# Patient Record
Sex: Male | Born: 1965 | Race: White | Hispanic: No | Marital: Married | Smoking: Light tobacco smoker
Health system: Southern US, Community
[De-identification: ages and names within clinical notes are randomized; demographics above are authoritative.]

## PROBLEM LIST (undated history)

## (undated) DIAGNOSIS — M1990 Unspecified osteoarthritis, unspecified site: Secondary | ICD-10-CM

## (undated) DIAGNOSIS — K219 Gastro-esophageal reflux disease without esophagitis: Secondary | ICD-10-CM

## (undated) DIAGNOSIS — K30 Functional dyspepsia: Secondary | ICD-10-CM

## (undated) DIAGNOSIS — E039 Hypothyroidism, unspecified: Secondary | ICD-10-CM

## (undated) DIAGNOSIS — K5792 Diverticulitis of intestine, part unspecified, without perforation or abscess without bleeding: Secondary | ICD-10-CM

## (undated) DIAGNOSIS — E079 Disorder of thyroid, unspecified: Secondary | ICD-10-CM

## (undated) HISTORY — PX: OTHER SURGICAL HISTORY: SHX169

## (undated) HISTORY — DX: Diverticulitis of intestine, part unspecified, without perforation or abscess without bleeding: K57.92

## (undated) HISTORY — PX: KNEE ARTHROSCOPY: SHX127

## (undated) HISTORY — PX: COLON SURGERY: SHX602

## (undated) SURGERY — REPAIR, HERNIA, VENTRAL, LAPAROSCOPY-ASSISTED
Anesthesia: General

---

## 2004-09-07 ENCOUNTER — Ambulatory Visit: Payer: Self-pay | Admitting: Unknown Physician Specialty

## 2004-12-28 ENCOUNTER — Ambulatory Visit: Payer: Self-pay | Admitting: Family Medicine

## 2005-01-06 ENCOUNTER — Ambulatory Visit: Payer: Self-pay | Admitting: Family Medicine

## 2009-08-16 ENCOUNTER — Encounter: Payer: Self-pay | Admitting: Psychiatry

## 2009-09-10 ENCOUNTER — Encounter: Payer: Self-pay | Admitting: Psychiatry

## 2010-02-08 ENCOUNTER — Ambulatory Visit: Payer: Self-pay

## 2010-09-26 ENCOUNTER — Ambulatory Visit: Payer: Self-pay | Admitting: Family Medicine

## 2011-07-17 ENCOUNTER — Ambulatory Visit: Payer: Self-pay | Admitting: Internal Medicine

## 2013-11-11 DIAGNOSIS — K5720 Diverticulitis of large intestine with perforation and abscess without bleeding: Secondary | ICD-10-CM | POA: Insufficient documentation

## 2014-03-04 DIAGNOSIS — Z933 Colostomy status: Secondary | ICD-10-CM | POA: Insufficient documentation

## 2014-11-16 ENCOUNTER — Inpatient Hospital Stay
Admission: EM | Admit: 2014-11-16 | Discharge: 2014-11-18 | DRG: 379 | Disposition: A | Discharge status: Home / Self Care | Payer: BC Managed Care – PPO | Attending: Internal Medicine | Admitting: Internal Medicine

## 2014-11-16 ENCOUNTER — Encounter: Payer: Self-pay | Admitting: Emergency Medicine

## 2014-11-16 ENCOUNTER — Emergency Department: Payer: BC Managed Care – PPO

## 2014-11-16 DIAGNOSIS — K219 Gastro-esophageal reflux disease without esophagitis: Secondary | ICD-10-CM | POA: Diagnosis present

## 2014-11-16 DIAGNOSIS — Z8349 Family history of other endocrine, nutritional and metabolic diseases: Secondary | ICD-10-CM | POA: Diagnosis not present

## 2014-11-16 DIAGNOSIS — K922 Gastrointestinal hemorrhage, unspecified: Principal | ICD-10-CM

## 2014-11-16 DIAGNOSIS — F17210 Nicotine dependence, cigarettes, uncomplicated: Secondary | ICD-10-CM | POA: Diagnosis present

## 2014-11-16 DIAGNOSIS — K921 Melena: Secondary | ICD-10-CM

## 2014-11-16 DIAGNOSIS — F1010 Alcohol abuse, uncomplicated: Secondary | ICD-10-CM | POA: Diagnosis present

## 2014-11-16 DIAGNOSIS — E039 Hypothyroidism, unspecified: Secondary | ICD-10-CM | POA: Diagnosis present

## 2014-11-16 DIAGNOSIS — Z833 Family history of diabetes mellitus: Secondary | ICD-10-CM

## 2014-11-16 DIAGNOSIS — Z8261 Family history of arthritis: Secondary | ICD-10-CM

## 2014-11-16 DIAGNOSIS — M1990 Unspecified osteoarthritis, unspecified site: Secondary | ICD-10-CM | POA: Diagnosis present

## 2014-11-16 DIAGNOSIS — R1013 Epigastric pain: Secondary | ICD-10-CM

## 2014-11-16 HISTORY — DX: Unspecified osteoarthritis, unspecified site: M19.90

## 2014-11-16 HISTORY — DX: Disorder of thyroid, unspecified: E07.9

## 2014-11-16 LAB — CBC
HCT: 39.3 % — ABNORMAL LOW (ref 40.0–52.0)
Hemoglobin: 13.6 g/dL (ref 13.0–18.0)
MCH: 30.2 pg (ref 26.0–34.0)
MCHC: 34.5 g/dL (ref 32.0–36.0)
MCV: 87.4 fL (ref 80.0–100.0)
PLATELETS: 220 10*3/uL (ref 150–440)
RBC: 4.5 MIL/uL (ref 4.40–5.90)
RDW: 12.7 % (ref 11.5–14.5)
WBC: 10.2 10*3/uL (ref 3.8–10.6)

## 2014-11-16 LAB — COMPREHENSIVE METABOLIC PANEL
ALK PHOS: 78 U/L (ref 38–126)
ALT: 17 U/L (ref 17–63)
AST: 41 U/L (ref 15–41)
Albumin: 4.3 g/dL (ref 3.5–5.0)
Anion gap: 8 (ref 5–15)
BILIRUBIN TOTAL: 0.7 mg/dL (ref 0.3–1.2)
BUN: 15 mg/dL (ref 6–20)
CALCIUM: 9.5 mg/dL (ref 8.9–10.3)
CHLORIDE: 102 mmol/L (ref 101–111)
CO2: 26 mmol/L (ref 22–32)
CREATININE: 1.17 mg/dL (ref 0.61–1.24)
Glucose, Bld: 122 mg/dL — ABNORMAL HIGH (ref 65–99)
Potassium: 4.3 mmol/L (ref 3.5–5.1)
Sodium: 136 mmol/L (ref 135–145)
TOTAL PROTEIN: 6.9 g/dL (ref 6.5–8.1)

## 2014-11-16 LAB — HEMOGLOBIN: Hemoglobin: 12.7 g/dL — ABNORMAL LOW (ref 13.0–18.0)

## 2014-11-16 LAB — TYPE AND SCREEN
ABO/RH(D): B POS
ANTIBODY SCREEN: NEGATIVE

## 2014-11-16 LAB — ABO/RH: ABO/RH(D): B POS

## 2014-11-16 LAB — LIPASE, BLOOD: LIPASE: 24 U/L (ref 22–51)

## 2014-11-16 LAB — TROPONIN I

## 2014-11-16 MED ORDER — MORPHINE SULFATE (PF) 4 MG/ML IV SOLN
INTRAVENOUS | Status: AC
  Administered 2014-11-16: 4 mg via INTRAVENOUS
  Filled 2014-11-16: qty 1

## 2014-11-16 MED ORDER — SENNOSIDES-DOCUSATE SODIUM 8.6-50 MG PO TABS: 1.0000 | ORAL_TABLET | Freq: Every evening | ORAL | Status: DC | PRN

## 2014-11-16 MED ORDER — PANTOPRAZOLE SODIUM 40 MG IV SOLR
40.0000 mg | Freq: Two times a day (BID) | INTRAVENOUS | Status: DC
  Administered 2014-11-16 – 2014-11-18 (×4): 40 mg via INTRAVENOUS
  Filled 2014-11-16 (×5): qty 40

## 2014-11-16 MED ORDER — ONDANSETRON HCL 4 MG/2ML IJ SOLN
INTRAMUSCULAR | Status: AC
  Administered 2014-11-16: 4 mg via INTRAVENOUS
  Filled 2014-11-16: qty 2

## 2014-11-16 MED ORDER — LEVOTHYROXINE SODIUM 88 MCG PO TABS
88.0000 ug | ORAL_TABLET | Freq: Every day | ORAL | Status: DC
  Administered 2014-11-17 – 2014-11-18 (×2): 88 ug via ORAL
  Filled 2014-11-16 (×2): qty 1

## 2014-11-16 MED ORDER — ONDANSETRON HCL 4 MG/2ML IJ SOLN: 4.0000 mg | Freq: Four times a day (QID) | INTRAMUSCULAR | Status: DC | PRN

## 2014-11-16 MED ORDER — MORPHINE SULFATE (PF) 4 MG/ML IV SOLN
4.0000 mg | Freq: Once | INTRAVENOUS | Status: AC
  Administered 2014-11-16: 4 mg via INTRAVENOUS

## 2014-11-16 MED ORDER — LORATADINE 10 MG PO TABS
10.0000 mg | ORAL_TABLET | Freq: Every day | ORAL | Status: DC
  Administered 2014-11-17 – 2014-11-18 (×2): 10 mg via ORAL
  Filled 2014-11-16 (×3): qty 1

## 2014-11-16 MED ORDER — ALUM & MAG HYDROXIDE-SIMETH 200-200-20 MG/5ML PO SUSP: 30.0000 mL | Freq: Four times a day (QID) | ORAL | Status: DC | PRN

## 2014-11-16 MED ORDER — SODIUM CHLORIDE 0.9 % IV SOLN
8.0000 mg/h | INTRAVENOUS | Status: DC
  Administered 2014-11-16: 8 mg/h via INTRAVENOUS
  Filled 2014-11-16: qty 80

## 2014-11-16 MED ORDER — ACETAMINOPHEN 325 MG PO TABS: 650.0000 mg | ORAL_TABLET | Freq: Four times a day (QID) | ORAL | Status: DC | PRN

## 2014-11-16 MED ORDER — ONDANSETRON HCL 4 MG/2ML IJ SOLN
4.0000 mg | Freq: Once | INTRAMUSCULAR | Status: AC
  Administered 2014-11-16: 4 mg via INTRAVENOUS

## 2014-11-16 MED ORDER — ONDANSETRON HCL 4 MG PO TABS: 4.0000 mg | ORAL_TABLET | Freq: Four times a day (QID) | ORAL | Status: DC | PRN

## 2014-11-16 MED ORDER — SODIUM CHLORIDE 0.9 % IJ SOLN
3.0000 mL | Freq: Two times a day (BID) | INTRAMUSCULAR | Status: DC
  Administered 2014-11-16 – 2014-11-18 (×3): 3 mL via INTRAVENOUS

## 2014-11-16 MED ORDER — HYDROCODONE-ACETAMINOPHEN 5-325 MG PO TABS: 0.5000 | ORAL_TABLET | Freq: Four times a day (QID) | ORAL | Status: DC | PRN

## 2014-11-16 MED ORDER — NICOTINE 21 MG/24HR TD PT24
21.0000 mg | MEDICATED_PATCH | Freq: Every day | TRANSDERMAL | Status: DC
  Administered 2014-11-16 – 2014-11-18 (×3): 21 mg via TRANSDERMAL
  Filled 2014-11-16 (×3): qty 1

## 2014-11-16 MED ORDER — PANTOPRAZOLE SODIUM 40 MG IV SOLR: 40.0000 mg | Freq: Two times a day (BID) | INTRAVENOUS | Status: DC

## 2014-11-16 MED ORDER — SODIUM CHLORIDE 0.9 % IV SOLN
80.0000 mg | Freq: Once | INTRAVENOUS | Status: AC
  Administered 2014-11-16: 80 mg via INTRAVENOUS
  Filled 2014-11-16 (×2): qty 80

## 2014-11-16 MED ORDER — ACETAMINOPHEN 650 MG RE SUPP: 650.0000 mg | Freq: Four times a day (QID) | RECTAL | Status: DC | PRN

## 2014-11-16 MED ORDER — SODIUM CHLORIDE 0.9 % IV SOLN
INTRAVENOUS | Status: DC
  Administered 2014-11-16 – 2014-11-18 (×4): via INTRAVENOUS

## 2014-11-16 MED ORDER — HYDROCODONE-ACETAMINOPHEN 5-325 MG PO TABS
1.0000 | ORAL_TABLET | ORAL | Status: DC | PRN
  Administered 2014-11-16 – 2014-11-18 (×6): 2 via ORAL
  Filled 2014-11-16 (×6): qty 2

## 2014-11-16 NOTE — Plan of Care (Signed)
Problem: Consults Goal: GI Bleeding Patient Education See Patient Education Module for education specifics.  Outcome: Progressing GI consult pending  Problem: Phase I Progression Outcomes Goal: Pain controlled with appropriate interventions Outcome: Progressing Pt denies pain at this time Goal: Voiding-avoid urinary catheter unless indicated Outcome: Progressing Voiding independently

## 2014-11-16 NOTE — H&P (Addendum)
[facility] - [place] at [facility]   PATIENT NAME: Brian Lang    MR#:  [id number]  DATE OF BIRTH:  05-Sep-1965  DATE OF ADMISSION:  11/16/2014  PRIMARY CARE PHYSICIAN: Jerl Mina, MD   REQUESTING/REFERRING PHYSICIAN: dr Langston Masker  CHIEF COMPLAINT:  Dark-colored stool HISTORY OF PRESENT ILLNESS:  Brian Lang  is a 49 y.o. male with a known history of hypothyroidism and arthritis who presents with above complaint. Patient reports on Friday he developed midepigastric pain. On Saturday he then had dark-colored stools. He presented today due to ongoing dark-colored stool and abdominal pain. He does report a few days ago he took a few Aleve. He does not take aspirin or NSAIDs on a daily basis. He drinks on call on the weekends about 6-8 cans of beer.  PAST MEDICAL HISTORY:   Past Medical History  Diagnosis Date  . Arthritis   . Thyroid disease     PAST SURGICAL HISTORY:   Past Surgical History  Procedure Laterality Date  . Colon surgery      SOCIAL HISTORY:  Patient uses chewing tobacco daily and he drinks 6-8 beers on the weekends  FAMILY HISTORY:  Positive history of diabetes  DRUG ALLERGIES:   Allergies  Allergen Reactions  . Augmentin [Amoxicillin-Pot Clavulanate] Itching, Swelling and Rash     REVIEW OF SYSTEMS:  CONSTITUTIONAL: No fever, fatigue positive generalized weakness.  EYES: No blurred or double vision.  EARS, NOSE, AND THROAT: No tinnitus or ear pain.  RESPIRATORY: No cough, shortness of breath, wheezing or hemoptysis.  CARDIOVASCULAR: No chest pain, orthopnea, edema.  GASTROINTESTINAL: No nausea, vomiting, diarrhea  positive mid epigastric abdominal pain  positive coffee-ground emesis and melena GENITOURINARY: No dysuria, hematuria.  ENDOCRINE: No polyuria, nocturia,  HEMATOLOGY: No anemia, easy bruising or bleeding SKIN: No rash or lesion. MUSCULOSKELETAL: No joint pain ++ neck arthritis.   NEUROLOGIC: No tingling,  numbness, weakness.  PSYCHIATRY: No anxiety or depression.   MEDICATIONS AT HOME:   Prior to Admission medications   Medication Sig Start Date End Date Taking? Authorizing Provider  cetirizine (ZYRTEC) 10 MG tablet Take 10 mg by mouth daily.   Yes Historical Provider, MD  HYDROcodone-acetaminophen (NORCO/VICODIN) 5-325 MG per tablet Take 0.5-1 tablets by mouth every 6 (six) hours as needed for moderate pain.    Yes Historical Provider, MD  levothyroxine (SYNTHROID, LEVOTHROID) 88 MCG tablet Take 88 mcg by mouth daily before breakfast.   Yes Historical Provider, MD      VITAL SIGNS:  Blood pressure 122/98, pulse 70, temperature 98 F (36.7 C), temperature source Oral, resp. rate 11, height 5\' 7"  (1.702 m), weight 73.483 kg (162 lb), SpO2 98 %.  PHYSICAL EXAMINATION:  GENERAL:  49 y.o.-year-old patient lying in the bed with no acute distress.  EYES: Pupils equal, round, reactive to light and accommodation. No scleral icterus. Extraocular muscles intact.  HEENT: Head atraumatic, normocephalic. Oropharynx and nasopharynx clear.  NECK:  Supple, no jugular venous distention. No thyroid enlargement, no tenderness.  LUNGS: Normal breath sounds bilaterally, no wheezing, rales,rhonchi or crepitation. No use of accessory muscles of respiration.  CARDIOVASCULAR: S1, S2 normal. No murmurs, rubs, or gallops.  ABDOMEN: Soft, tender midepigastric without rebound or guarding, nondistended. Bowel sounds present. No organomegaly or mass.  EXTREMITIES: No pedal edema, cyanosis, or clubbing.  NEUROLOGIC: Cranial nerves II through XII are grossly intact. No focal deficits. PSYCHIATRIC: The patient is alert and oriented x 3.  SKIN: No obvious rash, lesion, or ulcer.  LABORATORY PANEL:   CBC  Recent Labs Lab 11/16/14 0952  WBC 10.2  HGB 13.6  HCT 39.3*  PLT 220   ------------------------------------------------------------------------------------------------------------------  Chemistries    Recent Labs Lab 11/16/14 0952  NA 136  K 4.3  CL 102  CO2 26  GLUCOSE 122*  BUN 15  CREATININE 1.17  CALCIUM 9.5  AST 41  ALT 17  ALKPHOS 78  BILITOT 0.7   ------------------------------------------------------------------------------------------------------------------  Cardiac Enzymes  Recent Labs Lab 11/16/14 0852  TROPONINI <0.03   ------------------------------------------------------------------------------------------------------------------  RADIOLOGY:  Dg Chest 1 View  11/16/2014   CLINICAL DATA:  49 year old male with dark stool 3 days ago and dark emesis 2 days ago, complaining of intermittent mid to upper abdominal pain and epigastric pain for the past 3 days.  EXAM: CHEST  1 VIEW  COMPARISON:  No priors.  FINDINGS: Lung volumes are low. No consolidative airspace disease. No pleural effusions. No pneumothorax. No pulmonary nodule or mass noted. Pulmonary vasculature and the cardiomediastinal silhouette are within normal limits.  IMPRESSION: 1. Low lung volumes without radiographic evidence of acute cardiopulmonary disease.   Electronically Signed   By: Trudie Reed M.D.   On: 11/16/2014 11:06    EKG:   normal sinus rhythm no St. elevation depression   THIS A VERY PLEASANT 58-YEAR-OLD MALE WITH HISTORY OF HYPOTHYROIDISM AND ARTHRITIS WHO PRESENTS WITH UPPER GI BLEED.   1. Upper GI bleed: Patient presents with melena as well as coffee-ground emesis. He reports taking Aleve a few days prior to these episodes. Lip more than likely this is an upper GI bleed. Patient will be started on Protonix 40 mg IV twice a day. Next hemoglobin is at 1400. GI consultation has been placed. Continue to hold NSAIDs at this time.   2. Hypothyroid : Patient will continue on Synthroid.  3. Arthritis: Patient continue with hydrocodone when necessary for pain no NSAIDs.  4. Tobacco dependence: Patient is encouraged to stop tobacco products Patient was counseled for 3 minutes.  Patient is not interested at this time, however he does want a nicotine patch. 5. EtOh abuse: CIWA protocol  All the records are reviewed and case discussed with ED provider. Management plans discussed with the patient and he is in agreement.  CODE STATUS: FULL  TOTAL TIME TAKING CARE OF THIS PATIENT: 45 minutes.    Iran Kievit M.D on 11/16/2014 at 11:33 AM  Between 7am to 6pm - Pager - [phone] After 6pm go to [email or web address] - password [email or web address] [facility]  [place] [facility]  Office  [phone]  CC: Primary care physician; Jerl Mina, MD

## 2014-11-16 NOTE — ED Provider Notes (Signed)
[facility] Emergency Department Provider Note  ____________________________________________  Time seen: Approximately 945 AM  I have reviewed the triage vital signs and the nursing notes.   HISTORY  Chief Complaint GI Bleeding    HPI Brian Lang is a 49 y.o. male who is presenting today with 4 days of dark stool and dark emesis 2 this past Saturday. He complains of mild nausea and intermittent epigastric pain at this time. He says the pain is intermittent and does not have any exacerbating factors. He says that he does not have worsening of his pain with food. He has had multiple surgeries for diverticulosis, the last being this past December. He denies any left lower quadrant pain. He describes his stool is black.Patient was sent here from his primary care doctor's office. Says that he drinks on the weekends up to 6-8 drinks.   Past Medical History  Diagnosis Date  . Arthritis   . Thyroid disease     There are no active problems to display for this patient.   Past Surgical History  Procedure Laterality Date  . Colon surgery      No current outpatient prescriptions on file.  Allergies Augmentin  No family history on file.  Social History Social History  Substance Use Topics  . Smoking status: Light Tobacco Smoker  . Smokeless tobacco: Not on file  . Alcohol Use: No    Review of Systems Constitutional: No fever/chills Eyes: No visual changes. ENT: No sore throat. Cardiovascular: Denies chest pain. Respiratory: Denies shortness of breath. Gastrointestinal: No diarrhea.  No constipation. Genitourinary: Negative for dysuria. Musculoskeletal: Negative for back pain. Skin: Negative for rash. Neurological: Negative for headaches, focal weakness or numbness.  10-point ROS otherwise negative.  ____________________________________________   PHYSICAL EXAM:  VITAL SIGNS: ED Triage Vitals  Enc Vitals Group     BP 11/16/14 0933  147/97 mmHg     Pulse Rate 11/16/14 0933 70     Resp 11/16/14 0933 17     Temp 11/16/14 0933 98 F (36.7 C)     Temp Source 11/16/14 0933 Oral     SpO2 11/16/14 0933 100 %     Weight 11/16/14 0933 162 lb (73.483 kg)     Height 11/16/14 0933 5\' 7"  (1.702 m)     Head Cir --      Peak Flow --      Pain Score 11/16/14 0934 3     Pain Loc --      Pain Edu? --      Excl. in GC? --     Constitutional: Alert and oriented. Well appearing and in no acute distress. Eyes: Conjunctivae are normal. PERRL. EOMI. Head: Atraumatic. Nose: No congestion/rhinnorhea. Mouth/Throat: Mucous membranes are moist.  Oropharynx non-erythematous. Neck: No stridor.   Cardiovascular: Normal rate, regular rhythm. Grossly normal heart sounds.  Good peripheral circulation. Respiratory: Normal respiratory effort.  No retractions. Lungs CTAB. Gastrointestinal: Soft and nontender. No distention. No abdominal bruits. No CVA tenderness. Melanotic stool grossly.  Musculoskeletal: No lower extremity tenderness nor edema.  No joint effusions. Neurologic:  Normal speech and language. No gross focal neurologic deficits are appreciated. No gait instability. Skin:  Skin is warm, dry and intact. No rash noted. Psychiatric: Mood and affect are normal. Speech and behavior are normal.  ____________________________________________   LABS (all labs ordered are listed, but only abnormal results are displayed)  Labs Reviewed  COMPREHENSIVE METABOLIC PANEL - Abnormal; Notable for the following:    Glucose,  Bld 122 (*)    All other components within normal limits  CBC - Abnormal; Notable for the following:    HCT 39.3 (*)    All other components within normal limits  LIPASE, BLOOD  TROPONIN I  TYPE AND SCREEN  ABO/RH   ____________________________________________  EKG  ED ECG REPORT I, Marleah Beever,  Teena Irani, the attending physician, personally viewed and interpreted this ECG.   Date: 11/16/2014  EKG Time: 935  Rate:  75  Rhythm: normal EKG, normal sinus rhythm  Axis: Normal axis  Intervals:none  ST&T Change: No ST segment elevation or depression. No abnormal T-wave inversion.   ____________________________________________  RADIOLOGY  Pending chest x-ray ____________________________________________   PROCEDURES    ____________________________________________   INITIAL IMPRESSION / ASSESSMENT AND PLAN / ED COURSE  Pertinent labs & imaging results that were available during my care of the patient were reviewed by me and considered in my medical decision making (see chart for details).  ----------------------------------------- 10:45 AM on 11/16/2014 -----------------------------------------  Patient stable with gastric distal bleeding. Started on Protonix drip. Feel that he likely has bleeding from an ulcer. Signed out to Dr. Tildon Husky. ____________________________________________   FINAL CLINICAL IMPRESSION(S) / ED DIAGNOSES  Acute gastrointestinal bleeding with epigastric abdominal pain. Initial visit.    Myrna Blazer, MD 11/16/14 1046

## 2014-11-16 NOTE — ED Notes (Signed)
From home with c/o dark stool x 1 on friday and dark emesis x 2 saturday. Also intermitant mid, upper abdominal pain since Friday. Pt with hx of diverticulitis surgery august 2015.

## 2014-11-16 NOTE — Progress Notes (Signed)
Pt admitted to 201 from ED.  Report given by Nellie, via telephone.  Oriented to nsg staff, room, unit.  Admission, safety, POC reviewed.  All questions answered; pt verbalized understanding.  Will continue to monitor.

## 2014-11-17 LAB — CBC
HCT: 33.6 % — ABNORMAL LOW (ref 40.0–52.0)
HEMOGLOBIN: 11.5 g/dL — AB (ref 13.0–18.0)
MCH: 29.9 pg (ref 26.0–34.0)
MCHC: 34.2 g/dL (ref 32.0–36.0)
MCV: 87.4 fL (ref 80.0–100.0)
Platelets: 182 10*3/uL (ref 150–440)
RBC: 3.85 MIL/uL — ABNORMAL LOW (ref 4.40–5.90)
RDW: 12.5 % (ref 11.5–14.5)
WBC: 5.8 10*3/uL (ref 3.8–10.6)

## 2014-11-17 MED ORDER — SODIUM CHLORIDE 0.9 % IV SOLN: INTRAVENOUS | Status: DC

## 2014-11-17 NOTE — Consult Note (Signed)
GI Inpatient Consult Note  Reason for Consult:   Attending Requesting Consult:  History of Present Illness: Brian Lang Brian Lang is a 49 y.o. male with mid epigastric pain starting on Fri, followed by coffee ground emesis on Sat, and then dark stools on Sunday. Denies recent NSAIDS. Typically drinks lot of beer on weekends but denies drinking before current episode. Was able to tolerate full liquid diet this AM. Had a similar episode in March for which he went to [facility]. Not treated then.  Past Medical History:  Past Medical History  Diagnosis Date  . Arthritis   . Thyroid disease     Problem List: Patient Active Problem List   Diagnosis Date Noted  . GIB (gastrointestinal bleeding) 11/16/2014    Past Surgical History: Past Surgical History  Procedure Laterality Date  . Colon surgery      Allergies: Allergies  Allergen Reactions  . Augmentin [Amoxicillin-Pot Clavulanate] Itching, Swelling and Rash    Home Medications: Prescriptions prior to admission  Medication Sig Dispense Refill Last Dose  . cetirizine (ZYRTEC) 10 MG tablet Take 10 mg by mouth daily.   11/16/2014 at Unknown time  . HYDROcodone-acetaminophen (NORCO/VICODIN) 5-325 MG per tablet Take 0.5-1 tablets by mouth every 6 (six) hours as needed for moderate pain.    11/16/2014 at 0800  . levothyroxine (SYNTHROID, LEVOTHROID) 88 MCG tablet Take 88 mcg by mouth daily before breakfast.   11/16/2014 at Unknown time   Home medication reconciliation was completed with the patient.   Scheduled Inpatient Medications:   . levothyroxine  88 mcg Oral QAC breakfast  . loratadine  10 mg Oral Daily  . nicotine  21 mg Transdermal Daily  . pantoprazole (PROTONIX) IV  40 mg Intravenous Q12H  . sodium chloride  3 mL Intravenous Q12H    Continuous Inpatient Infusions:   . sodium chloride 100 mL/hr at 11/16/14 2335  . sodium chloride      PRN Inpatient Medications:  acetaminophen **OR** acetaminophen, alum & mag  hydroxide-simeth, HYDROcodone-acetaminophen, ondansetron **OR** ondansetron (ZOFRAN) IV, senna-docusate  Family History: family history is not on file.  The patient's family history is negative for inflammatory bowel disorders, GI malignancy, or solid organ transplantation.  Social History:   reports that he has been smoking.  His smokeless tobacco use includes Chew. He reports that he does not drink alcohol or use illicit drugs. The patient denies ETOH, tobacco, or drug use.   Review of Systems: Constitutional: Weight is stable.  Eyes: No changes in vision. ENT: No oral lesions, sore throat.  GI: see HPI.  Heme/Lymph: No easy bruising.  CV: No chest pain.  GU: No hematuria.  Integumentary: No rashes.  Neuro: No headaches.  Psych: No depression/anxiety.  Endocrine: No heat/cold intolerance.  Allergic/Immunologic: No urticaria.  Resp: No cough, SOB.  Musculoskeletal: No joint swelling.    Physical Examination: BP 111/71 mmHg  Pulse 60  Temp(Src) 98 F (36.7 C) (Oral)  Resp 18  Ht  (1.702 m)  Wt 73.483 kg (162 lb)  BMI 25.37 kg/m2  SpO2 98% Gen: NAD, alert and oriented x 4 HEENT: PEERLA, EOMI, Neck: supple, no JVD or thyromegaly Chest: CTA bilaterally, no wheezes, crackles, or other adventitious sounds CV: RRR, no m/g/c/r Abd: soft, mid epigastric tenderness, ND, +BS in all four quadrants; no HSM, guarding, ridigity, or rebound tenderness Ext: no edema, well perfused with 2+ pulses, Skin: no rash or lesions noted Lymph: no LAD  Data: Lab Results  Component Value Date  WBC 5.8 11/17/2014   HGB 11.5* 11/17/2014   HCT 33.6* 11/17/2014   MCV 87.4 11/17/2014   PLT 182 11/17/2014    Recent Labs Lab 11/16/14 0952 11/16/14 1621 11/17/14 0417  HGB 13.6 12.7* 11.5*   Lab Results  Component Value Date   NA 136 11/16/2014   K 4.3 11/16/2014   CL 102 11/16/2014   CO2 26 11/16/2014   BUN 15 11/16/2014   CREATININE 1.17 11/16/2014   Lab Results  Component  Value Date   ALT 17 11/16/2014   AST 41 11/16/2014   ALKPHOS 78 11/16/2014   BILITOT 0.7 11/16/2014   No results for input(s): APTT, INR, PTT in the last 168 hours. Assessment/Plan: Mr. Brian Lang is a 49 y.o. male with possible PUD or alcohol induced gastritis.   Recommendations: Reg diet today. NPO after MN. PPI daily. EGD tomorrow.  Thank you for the consult. Please call with questions or concerns.  Vencil Basnett, Ezzard Standing, MD

## 2014-11-17 NOTE — Progress Notes (Signed)
[facility] - [place] at [facility]   PATIENT NAME: Brian Lang    MR#:  [id number]  DATE OF BIRTH:  [birth year]  SUBJECTIVE:  Patient continues have some mild midepigastric pain. He denies hematochezia or coffee-ground emesis.  REVIEW OF SYSTEMS:    Review of Systems  Constitutional: Negative for fever, chills and malaise/fatigue.  HENT: Negative for sore throat.   Eyes: Negative for blurred vision.  Respiratory: Negative for cough, hemoptysis, shortness of breath and wheezing.   Cardiovascular: Negative for chest pain, palpitations and leg swelling.  Gastrointestinal: Negative for nausea, vomiting, abdominal pain, diarrhea and blood in stool.  Genitourinary: Negative for dysuria.  Musculoskeletal: Negative for back pain.  Neurological: Negative for dizziness, tremors and headaches.  Endo/Heme/Allergies: Does not bruise/bleed easily.    Tolerating Diet:clear liquid      DRUG ALLERGIES:   Allergies  Allergen Reactions  . Augmentin [Amoxicillin-Pot Clavulanate] Itching, Swelling and Rash    VITALS:  Blood pressure 111/71, pulse 60, temperature 98 F (36.7 C), temperature source Oral, resp. rate 18, height 5\' 7"  (1.702 m), weight 73.483 kg (162 lb), SpO2 98 %.  PHYSICAL EXAMINATION:   Physical Exam  Constitutional: He is oriented to person, place, and time and well-developed, well-nourished, and in no distress. No distress.  HENT:  Head: Normocephalic.  Eyes: No scleral icterus.  Neck: Normal range of motion. Neck supple. No JVD present. No tracheal deviation present.  Cardiovascular: Normal rate, regular rhythm and normal heart sounds.  Exam reveals no gallop and no friction rub.   No murmur heard. Pulmonary/Chest: Effort normal and breath sounds normal. No respiratory distress. He has no wheezes. He has no rales. He exhibits no tenderness.  Abdominal: Soft. Bowel sounds are normal. He exhibits no distension and no mass. There is no  tenderness. There is no rebound and no guarding.  Musculoskeletal: Normal range of motion. He exhibits no edema.  Neurological: He is alert and oriented to person, place, and time.  Skin: Skin is warm. No rash noted. No erythema.  Psychiatric: Affect and judgment normal.      LABORATORY PANEL:   CBC  Recent Labs Lab 11/17/14 0417  WBC 5.8  HGB 11.5*  HCT 33.6*  PLT 182   ------------------------------------------------------------------------------------------------------------------  Chemistries   Recent Labs Lab 11/16/14 0952  NA 136  K 4.3  CL 102  CO2 26  GLUCOSE 122*  BUN 15  CREATININE 1.17  CALCIUM 9.5  AST 41  ALT 17  ALKPHOS 78  BILITOT 0.7   ------------------------------------------------------------------------------------------------------------------  Cardiac Enzymes  Recent Labs Lab 11/16/14 0852  TROPONINI <0.03   ------------------------------------------------------------------------------------------------------------------  RADIOLOGY:  Dg Chest 1 View  11/16/2014   CLINICAL DATA:  49 year old male with dark stool 3 days ago and dark emesis 2 days ago, complaining of intermittent mid to upper abdominal pain and epigastric pain for the past 3 days.  EXAM: CHEST  1 VIEW  COMPARISON:  No priors.  FINDINGS: Lung volumes are low. No consolidative airspace disease. No pleural effusions. No pneumothorax. No pulmonary nodule or mass noted. Pulmonary vasculature and the cardiomediastinal silhouette are within normal limits.  IMPRESSION: 1. Low lung volumes without radiographic evidence of acute cardiopulmonary disease.   Electronically Signed   By: Trudie Reed M.D.   On: 11/16/2014 11:06     ASSESSMENT AND PLAN:    49 year old male with history of hypothyroidism and arthritis who presents with midepigastric abdominal pain likely secondary to an ulcer or alcohol related gastritis.  1. Upper GI bleed: Patient presented with melana as well as  coffee-ground emesis. Patient is planned for EGD in a.m. Continue to hold NSAIDs. Continue Protonix. Appreciate GI consultation.  2. Hypothyroidism: Continue Synthroid  3. Arthritis: Continue hydrocodone  4. EtOH abuse: Patient is on CIWA protocol  5. Tobacco dependence,: Patient is consulted on admission.     Management plans discussed with the patient and he is in agreement.  CODE STATUS: FULL  TOTAL TIME TAKING CARE OF THIS PATIENT: 35 minutes.     POSSIBLE D/C 1-2 days, DEPENDING ON CLINICAL CONDITION.   Ely Ballen M.D on 11/17/2014 at 12:19 PM  Between 7am to 6pm - Pager - [phone] After 6pm go to [email or web address] - password [email or web address] [facility]  [place] [facility]  Office  [phone]  CC: Primary care physician; Jerl Mina, MD

## 2014-11-18 ENCOUNTER — Encounter: Payer: Self-pay | Admitting: *Deleted

## 2014-11-18 ENCOUNTER — Inpatient Hospital Stay: Payer: BC Managed Care – PPO | Admitting: Anesthesiology

## 2014-11-18 ENCOUNTER — Encounter
Admission: EM | Disposition: A | Discharge status: Home / Self Care | Payer: Self-pay | Source: Home / Self Care | Attending: Internal Medicine

## 2014-11-18 HISTORY — PX: ESOPHAGOGASTRODUODENOSCOPY: SHX5428

## 2014-11-18 LAB — CBC
HEMATOCRIT: 33.5 % — AB (ref 40.0–52.0)
HEMOGLOBIN: 12 g/dL — AB (ref 13.0–18.0)
MCH: 31.4 pg (ref 26.0–34.0)
MCHC: 35.8 g/dL (ref 32.0–36.0)
MCV: 87.5 fL (ref 80.0–100.0)
Platelets: 183 10*3/uL (ref 150–440)
RBC: 3.83 MIL/uL — ABNORMAL LOW (ref 4.40–5.90)
RDW: 12.6 % (ref 11.5–14.5)
WBC: 5.8 10*3/uL (ref 3.8–10.6)

## 2014-11-18 LAB — BASIC METABOLIC PANEL
ANION GAP: 4 — AB (ref 5–15)
BUN: 13 mg/dL (ref 6–20)
CHLORIDE: 112 mmol/L — AB (ref 101–111)
CO2: 26 mmol/L (ref 22–32)
Calcium: 8.4 mg/dL — ABNORMAL LOW (ref 8.9–10.3)
Creatinine, Ser: 1.08 mg/dL (ref 0.61–1.24)
GFR calc non Af Amer: >60 mL/min (ref >60)
Glucose, Bld: 90 mg/dL (ref 65–99)
POTASSIUM: 4 mmol/L (ref 3.5–5.1)
Sodium: 142 mmol/L (ref 135–145)

## 2014-11-18 LAB — MAGNESIUM: Magnesium: 1.9 mg/dL (ref 1.7–2.4)

## 2014-11-18 SURGERY — EGD (ESOPHAGOGASTRODUODENOSCOPY)
Anesthesia: General

## 2014-11-18 MED ORDER — PROPOFOL INFUSION 10 MG/ML OPTIME
INTRAVENOUS | Status: DC | PRN
  Administered 2014-11-18: 100 ug/kg/min via INTRAVENOUS

## 2014-11-18 MED ORDER — LIDOCAINE HCL (CARDIAC) 20 MG/ML IV SOLN
INTRAVENOUS | Status: DC | PRN
  Administered 2014-11-18: 100 mg via INTRAVENOUS

## 2014-11-18 MED ORDER — FENTANYL CITRATE (PF) 100 MCG/2ML IJ SOLN
INTRAMUSCULAR | Status: DC | PRN
  Administered 2014-11-18: 50 ug via INTRAVENOUS

## 2014-11-18 MED ORDER — PROPOFOL 10 MG/ML IV BOLUS
INTRAVENOUS | Status: DC | PRN
  Administered 2014-11-18: 30 mg via INTRAVENOUS

## 2014-11-18 MED ORDER — PANTOPRAZOLE SODIUM 40 MG PO TBEC: 40.0000 mg | DELAYED_RELEASE_TABLET | Freq: Two times a day (BID) | ORAL | Status: DC

## 2014-11-18 MED ORDER — NICOTINE 21 MG/24HR TD PT24: 21.0000 mg | MEDICATED_PATCH | Freq: Every day | TRANSDERMAL | Status: DC

## 2014-11-18 MED ORDER — MIDAZOLAM HCL 2 MG/2ML IJ SOLN
INTRAMUSCULAR | Status: DC | PRN
  Administered 2014-11-18: 1 mg via INTRAVENOUS

## 2014-11-18 NOTE — Transfer of Care (Signed)
Immediate Anesthesia Transfer of Care Note  Patient: Yazan D [place]  Procedure(s) Performed: Procedure(s): ESOPHAGOGASTRODUODENOSCOPY (EGD) (N/A)  Patient Location: PACU  Anesthesia Type:General  Level of Consciousness: awake, alert  Airway & Oxygen Therapy: Patient Spontanous Breathing and Patient connected to nasal cannula oxygen  Post-op Assessment: Report given to RN and Post -op Vital signs reviewed and stable  Post vital signs: Reviewed and stable  Last Vitals:  Filed Vitals:   11/18/14 1319  BP: 112/71  Pulse: 60  Temp: 35.7 C  Resp: 10    Complications: No apparent anesthesia complications

## 2014-11-18 NOTE — Anesthesia Preprocedure Evaluation (Signed)
Anesthesia Evaluation  Patient identified by MRN, date of birth, ID band Patient awake    Reviewed: Allergy & Precautions, H&P , NPO status , Patient's Chart, lab work & pertinent test results, reviewed documented beta blocker date and time   History of Anesthesia Complications Negative for: history of anesthetic complications  Airway Mallampati: I  TM Distance: >3 FB Neck ROM: full    Dental no notable dental hx. (+) Missing, Teeth Intact   Pulmonary neg shortness of breath, sleep apnea (resolved since thyroid excision) , neg COPD, neg recent URI, Current Smoker,    Pulmonary exam normal breath sounds clear to auscultation       Cardiovascular Exercise Tolerance: Good negative cardio ROS Normal cardiovascular exam Rhythm:regular Rate:Normal     Neuro/Psych negative neurological ROS  negative psych ROS   GI/Hepatic Neg liver ROS, GERD  ,  Endo/Other  neg diabetesHypothyroidism   Renal/GU negative Renal ROS  negative genitourinary   Musculoskeletal   Abdominal   Peds  Hematology negative hematology ROS (+)   Anesthesia Other Findings Past Medical History:   Arthritis                                                    Thyroid disease                                              Reproductive/Obstetrics negative OB ROS                             Anesthesia Physical Anesthesia Plan  ASA: II  Anesthesia Plan: General   Post-op Pain Management:    Induction:   Airway Management Planned:   Additional Equipment:   Intra-op Plan:   Post-operative Plan:   Informed Consent: I have reviewed the patients History and Physical, chart, labs and discussed the procedure including the risks, benefits and alternatives for the proposed anesthesia with the patient or authorized representative who has indicated his/her understanding and acceptance.   Dental Advisory Given  Plan Discussed  with: Anesthesiologist, CRNA and Surgeon  Anesthesia Plan Comments:         Anesthesia Quick Evaluation

## 2014-11-18 NOTE — Op Note (Signed)
[facility] Patient Name: Brian Lang Procedure Date: 11/18/2014 1:15 PM MRN: [id number] Account #: [id number] Date of Birth: 1965-11-11 Admit Type: Inpatient Age: 49 Room: [facility] ENDO ROOM 4 Gender: Male Note Status: Finalized Procedure:         Upper GI endoscopy Indications:       Epigastric abdominal pain, Melena Providers:         Ezzard Standing. Bluford Kaufmann, MD Medicines:         Monitored Anesthesia Care Complications:     No immediate complications. Procedure:         Pre-Anesthesia Assessment:                    - Prior to the procedure, a History and Physical was                     performed, and patient medications, allergies and                     sensitivities were reviewed. The patient's tolerance of                     previous anesthesia was reviewed.                    - The risks and benefits of the procedure and the sedation                     options and risks were discussed with the patient. All                     questions were answered and informed consent was obtained.                    - After reviewing the risks and benefits, the patient was                     deemed in satisfactory condition to undergo the procedure.                    After obtaining informed consent, the endoscope was passed                     under direct vision. Throughout the procedure, the                     patient's blood pressure, pulse, and oxygen saturations                     were monitored continuously. The Olympus GIF-160 endoscope                     (S#. [id number]) was introduced through the mouth, and                     advanced to the second part of duodenum. The upper GI                     endoscopy was accomplished without difficulty. The patient                     tolerated the procedure well. Findings:      The examined esophagus was normal.      Localized mildly erythematous mucosa without bleeding was found in the  gastric antrum.  Biopsies were taken with a cold forceps for Helicobacter       pylori testing.      The exam was otherwise without abnormality.      Localized mildly erythematous mucosa without active bleeding was found       in the first part of the duodenum.      An acquired benign-appearing, intrinsic severe stenosis was found at 2nd       part of the duodenum and was traversed after dilation. A TTS dilator was       passed through the scope. Dilation with a 12-22-10 mm pyloric balloon       dilator was performed. The dilation site was examined and showed       moderate improvement in luminal narrowing.      The exam was otherwise without abnormality. Impression:        - Normal esophagus.                    - Erythematous mucosa in the antrum. Biopsied.                    - The examination was otherwise normal.                    - Erythematous duodenopathy.                    - Acquired duodenal stenosis. Dilated.                    - The examination was otherwise normal. Recommendation:    - Observe patient's clinical course.                    - Continue present medications.                    - The findings and recommendations were discussed with the                     patient. Procedure Code(s): --- Professional ---                    [phone], Esophagogastroduodenoscopy, flexible, transoral;                     with dilation of gastric/duodenal stricture(s) (eg,                     balloon, bougie)                    43239, Esophagogastroduodenoscopy, flexible, transoral;                     with biopsy, single or multiple Diagnosis Code(s): --- Professional ---                    K31.9, Disease of stomach and duodenum, unspecified                    K31.89, Other diseases of stomach and duodenum                    K31.5, Obstruction of duodenum                    R10.13, Epigastric pain  K92.1, Melena CPT copyright 2014 American Medical Association. All rights  reserved. The codes documented in this report are preliminary and upon coder review may  be revised to meet current compliance requirements. Wallace Cullens, MD 11/18/2014 2:11:36 PM This report has been signed electronically. Number of Addenda: 0 Note Initiated On: 11/18/2014 1:15 PM      [facility]

## 2014-11-18 NOTE — Discharge Summary (Signed)
[facility] [place] [facility]   PATIENT NAME: Brian Lang    MR#:  [id number]  DATE OF BIRTH:  [birth year]  DATE OF ADMISSION:  11/16/2014 ADMITTING PHYSICIAN: Adrian Saran, MD  DATE OF DISCHARGE:11/18/2014 PRIMARY CARE PHYSICIAN: Jerl Mina, MD    ADMISSION DIAGNOSIS:  Melena [K92.1] Epigastric pain [R10.13] Acute GI bleeding [K92.2]  DISCHARGE DIAGNOSIS:  Active Problems:   GIB (gastrointestinal bleeding)   SECONDARY DIAGNOSIS:   Past Medical History  Diagnosis Date  . Arthritis   . Thyroid disease     HOSPITAL COURSE:  49 year old male with history of hypothyroidism and EtOH use who presents with coffee-ground emesis. For further details please further H&P.  1. Upper GI bleed: Patient presented with coffee-ground emesis and melana. He underwent EGD. EGD showed gastritis and duodenitis with no active bleeding. Bx taken for H.pylori. More importantly, pt had a tight post bulbar stricture, which could lead to gastric outlet obstruction later. Thus, this was dilated to 12mm with TTS balloon. Then, scope passed to distal duodenum.He will continue with PPI.  2. Hypothyroidism: Continue Synthroid  3. EtOH abuse: Patient was placed on CIWA protocol. Detoxification was uneventful.  4. Arthritis: Patient was asked not to use NSAIDs. Patient will continue on hydrocodone.  DISCHARGE CONDITIONS AND DIET:  Discharged home in stable condition on regular diet  CONSULTS OBTAINED:  Treatment Team:  Wallace Cullens, MD  DRUG ALLERGIES:   Allergies  Allergen Reactions  . Augmentin [Amoxicillin-Pot Clavulanate] Itching, Swelling and Rash    DISCHARGE MEDICATIONS:   Current Discharge Medication List    START taking these medications   Details  nicotine (NICODERM CQ - DOSED IN MG/24 HOURS) 21 mg/24hr patch Place 1 patch (21 mg total) onto the skin daily. Qty: 28 patch, Refills: 0    pantoprazole (PROTONIX) 40 MG tablet Take 1 tablet (40 mg total)  by mouth 2 (two) times daily before a meal. Qty: 60 tablet, Refills: 0      CONTINUE these medications which have NOT CHANGED   Details  cetirizine (ZYRTEC) 10 MG tablet Take 10 mg by mouth daily.    HYDROcodone-acetaminophen (NORCO/VICODIN) 5-325 MG per tablet Take 0.5-1 tablets by mouth every 6 (six) hours as needed for moderate pain.     levothyroxine (SYNTHROID, LEVOTHROID) 88 MCG tablet Take 88 mcg by mouth daily before breakfast.              Today   CHIEF COMPLAINT:   Issues overnight. No coffee-ground emesis. No dizziness or lightheadedness   VITAL SIGNS:  Blood pressure 124/80, pulse 68, temperature 98 F (36.7 C), temperature source Oral, resp. rate 16, height  (1.702 m), weight 73.483 kg (162 lb), SpO2 98 %.   REVIEW OF SYSTEMS:  Review of Systems  Constitutional: Negative for fever, chills and malaise/fatigue.  HENT: Negative for sore throat.   Eyes: Negative for blurred vision.  Respiratory: Negative for cough, hemoptysis, shortness of breath and wheezing.   Cardiovascular: Negative for chest pain, palpitations and leg swelling.  Gastrointestinal: Negative for nausea, vomiting, abdominal pain, diarrhea and blood in stool.  Genitourinary: Negative for dysuria.  Musculoskeletal: Negative for back pain.  Neurological: Negative for dizziness, tremors and headaches.  Endo/Heme/Allergies: Does not bruise/bleed easily.     PHYSICAL EXAMINATION:  GENERAL:  49 y.o.-year-old patient lying in the bed with no acute distress.  NECK:  Supple, no jugular venous distention. No thyroid enlargement, no tenderness.  LUNGS: Normal breath sounds bilaterally, no wheezing,  rales,rhonchi  No use of accessory muscles of respiration.  CARDIOVASCULAR: S1, S2 normal. No murmurs, rubs, or gallops.  ABDOMEN: Soft, non-tender, non-distended. Bowel sounds present. No organomegaly or mass.  EXTREMITIES: No pedal edema, cyanosis, or clubbing.  PSYCHIATRIC: The patient is alert  and oriented x 3.  SKIN: No obvious rash, lesion, or ulcer.   DATA REVIEW:   CBC  Recent Labs Lab 11/18/14 0544  WBC 5.8  HGB 12.0*  HCT 33.5*  PLT 183    Chemistries   Recent Labs Lab 11/16/14 0952 11/18/14 0544  NA 136 142  K 4.3 4.0  CL 102 112*  CO2 26 26  GLUCOSE 122* 90  BUN 15 13  CREATININE 1.17 1.08  CALCIUM 9.5 8.4*  MG  --  1.9  AST 41  --   ALT 17  --   ALKPHOS 78  --   BILITOT 0.7  --     Cardiac Enzymes  Recent Labs Lab 11/16/14 0852  TROPONINI <0.03    Microbiology Results  @MICRORSLT48 @  RADIOLOGY:  Dg Chest 1 View  11/16/2014   CLINICAL DATA:  49 year old male with dark stool 3 days ago and dark emesis 2 days ago, complaining of intermittent mid to upper abdominal pain and epigastric pain for the past 3 days.  EXAM: CHEST  1 VIEW  COMPARISON:  No priors.  FINDINGS: Lung volumes are low. No consolidative airspace disease. No pleural effusions. No pneumothorax. No pulmonary nodule or mass noted. Pulmonary vasculature and the cardiomediastinal silhouette are within normal limits.  IMPRESSION: 1. Low lung volumes without radiographic evidence of acute cardiopulmonary disease.   Electronically Signed   By: Trudie Reed M.D.   On: 11/16/2014 11:06      Management plans discussed with the patient and he is in agreement. Stable for discharge home  Patient should follow up with PCP in 1 week and with Dr Bluford Kaufmann  CODE STATUS:     Code Status Orders        Start     Ordered   11/16/14 1247  Full code   Continuous     11/16/14 1246      TOTAL TIME TAKING CARE OF THIS PATIENT: 35 minutes.    Leighann Amadon M.D on 11/18/2014 at 10:37 AM  Between 7am to 6pm - Pager - [phone] After 6pm go to [email or web address] - password [email or web address]  [facility]  Office  [phone]  CC: Primary care physician; Jerl Mina, MD

## 2014-11-18 NOTE — Anesthesia Procedure Notes (Signed)
Performed by: Virda Betters Pre-anesthesia Checklist: Patient identified, Suction available, Emergency Drugs available, Patient being monitored and Timeout performed Patient Re-evaluated:Patient Re-evaluated prior to inductionOxygen Delivery Method: Nasal cannula Intubation Type: IV induction       

## 2014-11-18 NOTE — Op Note (Signed)
EGD showed gastritis and duodenitis with no active bleeding. Bx taken for H.pylori. More importantly, pt had a tight post bulbar stricture, which could lead to gastric outlet obstruction later. Thus, this was dilated to 12mm with TTS balloon. Then, scope passed to distal duodenum. Continue PPI daily. Ok for discharge. Pt can f/u with [place] in office in few weeks later. thanks

## 2014-11-19 LAB — SURGICAL PATHOLOGY

## 2014-11-20 ENCOUNTER — Encounter: Payer: Self-pay | Admitting: Gastroenterology

## 2014-11-20 NOTE — Anesthesia Postprocedure Evaluation (Signed)
  Anesthesia Post-op Note  Patient: Brian Lang  Procedure(s) Performed: Procedure(s): ESOPHAGOGASTRODUODENOSCOPY (EGD) (N/A)  Anesthesia type:General  Patient location: PACU  Post pain: Pain level controlled  Post assessment: Post-op Vital signs reviewed, Patient's Cardiovascular Status Stable, Respiratory Function Stable, Patent Airway and No signs of Nausea or vomiting  Post vital signs: Reviewed and stable  Last Vitals:  Filed Vitals:   11/18/14 1400  BP: 106/73  Pulse: 55  Temp:   Resp: 10    Level of consciousness: awake, alert  and patient cooperative  Complications: No apparent anesthesia complications

## 2015-07-06 ENCOUNTER — Encounter: Payer: Self-pay | Admitting: Surgery

## 2015-07-06 ENCOUNTER — Ambulatory Visit: Payer: Self-pay | Admitting: Surgery

## 2015-07-06 ENCOUNTER — Ambulatory Visit (INDEPENDENT_AMBULATORY_CARE_PROVIDER_SITE_OTHER): Payer: BC Managed Care – PPO | Admitting: Surgery

## 2015-07-06 ENCOUNTER — Other Ambulatory Visit: Payer: Self-pay

## 2015-07-06 VITALS — BP 150/82 | HR 64 | Temp 99.0°F | Ht 67.0 in | Wt 176.0 lb

## 2015-07-06 DIAGNOSIS — K439 Ventral hernia without obstruction or gangrene: Secondary | ICD-10-CM | POA: Diagnosis not present

## 2015-07-06 DIAGNOSIS — L409 Psoriasis, unspecified: Secondary | ICD-10-CM | POA: Insufficient documentation

## 2015-07-06 DIAGNOSIS — E785 Hyperlipidemia, unspecified: Secondary | ICD-10-CM | POA: Insufficient documentation

## 2015-07-06 DIAGNOSIS — M1990 Unspecified osteoarthritis, unspecified site: Secondary | ICD-10-CM | POA: Insufficient documentation

## 2015-07-06 DIAGNOSIS — R51 Headache: Secondary | ICD-10-CM

## 2015-07-06 DIAGNOSIS — I10 Essential (primary) hypertension: Secondary | ICD-10-CM | POA: Insufficient documentation

## 2015-07-06 DIAGNOSIS — E039 Hypothyroidism, unspecified: Secondary | ICD-10-CM | POA: Insufficient documentation

## 2015-07-06 DIAGNOSIS — M542 Cervicalgia: Secondary | ICD-10-CM | POA: Insufficient documentation

## 2015-07-06 DIAGNOSIS — R519 Headache, unspecified: Secondary | ICD-10-CM | POA: Insufficient documentation

## 2015-07-06 DIAGNOSIS — B029 Zoster without complications: Secondary | ICD-10-CM | POA: Insufficient documentation

## 2015-07-06 MED ORDER — NICOTINE 21 MG/24HR TD PT24: 21.0000 mg | MEDICATED_PATCH | Freq: Every day | TRANSDERMAL | Status: DC

## 2015-07-06 NOTE — Progress Notes (Signed)
Patient ID: Brian Lang, male   DOB: 02-03-1966, 50 y.o.   MRN: [id number]  HPI Brian Lang is a 50 y.o. male referred by Dr. Shelle Iron for recurrent ventral hernia. Initially patient had a perforated diverticulitis requiring a Hartmann's procedure in an outside hospital [place] he was out of town. This was close to 2 years ago. Most recently the patient had a colostomy takedown at [facility] about a year and a half ago without complications. Now comes with recurrence of multiple defects 2 along the midline laparotomy incision and another one on the ostomy defect. He reports some intermittent discomfort and mild dull pain but no evidence of incarceration or obstruction. He has good cardiovascular reserve and is able to perform more than 4 Mets of activity without any shortness of breath or chest pain. He does smoke on the weekends and dips tobacco during the week. No previous heart attacks or strokes.   HPI  Past Medical History  Diagnosis Date  . Arthritis   . Thyroid disease   . Diverticulitis     Past Surgical History  Procedure Laterality Date  . Colon surgery    . Esophagogastroduodenoscopy N/A 11/18/2014    Procedure: ESOPHAGOGASTRODUODENOSCOPY (EGD);  Surgeon: Wallace Cullens, MD;  Location: [facility] ENDOSCOPY;  Service: Endoscopy;  Laterality: N/A;    Family History  Problem Relation Age of Onset  . Diabetes Brother   . Esophageal cancer Maternal Uncle     Social History Social History  Substance Use Topics  . Smoking status: Light Tobacco Smoker  . Smokeless tobacco: Current User    Types: Chew     Comment: 1 can/day  . Alcohol Use: No    Allergies  Allergen Reactions  . Cyclobenzaprine     Other reaction(s): Hallucination  . Augmentin [Amoxicillin-Pot Clavulanate] Itching, Swelling and Rash    Current Outpatient Prescriptions  Medication Sig Dispense Refill  . cetirizine (ZYRTEC) 10 MG tablet Take 10 mg by mouth daily.    Marland Kitchen HYDROcodone-acetaminophen (NORCO/VICODIN)  5-325 MG per tablet Take 0.5-1 tablets by mouth every 6 (six) hours as needed for moderate pain.     Marland Kitchen levothyroxine (SYNTHROID, LEVOTHROID) 88 MCG tablet Take 88 mcg by mouth daily before breakfast.    . nicotine (NICODERM CQ - DOSED IN MG/24 HOURS) 21 mg/24hr patch Place 1 patch (21 mg total) onto the skin daily. Use 21 mg patch for 7 days, use 14 mg patch for 7 days, use  patch for 7 days. 28 patch 0   No current facility-administered medications for this visit.     Review of Systems A 10 point review of systems was asked and was negative except for the information on the HPI  Physical Exam Blood pressure 150/82, pulse 64, temperature 99 F (37.2 C), temperature source Oral, height  (1.702 m), weight 79.833 kg (176 lb). CONSTITUTIONAL: NAD well developed EYES: Pupils are equal, round, and reactive to light, Sclera are non-icteric. EARS, NOSE, MOUTH AND THROAT: The oropharynx is clear. The oral mucosa is pink and moist. Hearing is intact to voice. LYMPH NODES:  Lymph nodes in the neck are normal. RESPIRATORY:  Lungs are clear. There is normal respiratory effort, with equal breath sounds bilaterally, and without pathologic use of accessory muscles. CARDIOVASCULAR: Heart is regular without murmurs, gallops, or rubs. GI: The abdomen is  soft, nontender, and nondistended. To assist she is defects on the midline laparotomy scar. And there is a 2 cm defect where the previous  colostomy defect last. These are all reducible defects with no evidence of incarceration. THere is no loss of domain There are no palpable masses. There is no hepatosplenomegaly. There are normal bowel sounds in all quadrants. GU: Rectal deferred.   MUSCULOSKELETAL: Normal muscle strength and tone. No cyanosis or edema.   SKIN: Turgor is good and there are no pathologic skin lesions or ulcers. NEUROLOGIC: Motor and sensation is grossly normal. Cranial nerves are grossly intact. PSYCH:  Oriented to person, place and  time. Affect is normal.  Data Reviewed I have personally reviewed the patient's imaging, laboratory findings and medical records.    Assessment/Plan 50 year old with recurrent ventral hernias there are complex. There are 2 defects along the midline laparotomy incision and another defect where the ostomy site YS. He is currently a smoker. Discussed with the patient in detail about risk factor modification about importance about smoking cessation and also cessation of all negative products. We'll start the workup with an abdomen and pelvis CT to delineate the anatomy. I do think he might be a good candidate for a laparoscopic ventral hernia repair with mesh. I discussed with him that we needed to getting the best possible shape to improve the success of our repair.. I will prescribe nicotine patch, and we will reevaluate with a CT scan of the abdomen and pelvis in 3-4 weeks. Other than nicotine I do not think that there is other modifiable factors on this patient that we need to address. Extensive counseling provided. I do anticipate that this can be accomplished surgery requiring probably 4 hours of operative time. He understands and I also discussed with him the postoperative care, outcomes and expectations.  Sterling Bigiego Conleigh Heinlein, MD FACS General Surgeon 07/06/2015, 12:23 PM

## 2015-07-13 ENCOUNTER — Ambulatory Visit
Admission: RE | Admit: 2015-07-13 | Discharge: 2015-07-13 | Disposition: A | Discharge status: Home / Self Care | Payer: BC Managed Care – PPO | Source: Ambulatory Visit | Attending: Gastroenterology | Admitting: Gastroenterology

## 2015-07-13 DIAGNOSIS — K439 Ventral hernia without obstruction or gangrene: Secondary | ICD-10-CM | POA: Diagnosis not present

## 2015-07-13 DIAGNOSIS — K429 Umbilical hernia without obstruction or gangrene: Secondary | ICD-10-CM | POA: Insufficient documentation

## 2015-07-13 MED ORDER — IOPAMIDOL (ISOVUE-300) INJECTION 61%
100.0000 mL | Freq: Once | INTRAVENOUS | Status: AC | PRN
  Administered 2015-07-13: 100 mL via INTRAVENOUS

## 2015-07-28 ENCOUNTER — Encounter: Payer: Self-pay | Admitting: Surgery

## 2015-07-28 ENCOUNTER — Ambulatory Visit (INDEPENDENT_AMBULATORY_CARE_PROVIDER_SITE_OTHER): Payer: BC Managed Care – PPO | Admitting: Surgery

## 2015-07-28 ENCOUNTER — Ambulatory Visit: Payer: Self-pay | Admitting: Surgery

## 2015-07-28 VITALS — BP 106/69 | HR 58 | Temp 98.4°F | Ht 67.0 in | Wt 172.4 lb

## 2015-07-28 DIAGNOSIS — K432 Incisional hernia without obstruction or gangrene: Secondary | ICD-10-CM | POA: Diagnosis not present

## 2015-07-28 NOTE — Patient Instructions (Signed)
Please call our office if you have any questions or concerns.  

## 2015-07-29 ENCOUNTER — Telehealth: Payer: Self-pay | Admitting: Surgery

## 2015-07-29 NOTE — Progress Notes (Signed)
HPI Brian Lang is a 50 y.o. male referred by Dr. Shelle Lang for recurrent ventral hernia. Initially patient had a perforated diverticulitis requiring a Hartmann's procedure in an outside hospital [place] while he was out of town. This was close to 2 years ago. Most recently the patient had a [facility] about a year and a half ago without complications. Now comes with recurrence of multiple defects 2 along the midline laparotomy incision and another one on the ostomy defect. He reports some intermittent discomfort and mild dull pain but no evidence of incarceration or obstruction. He has good cardiovascular reserve and is able to perform more than 4 Mets of activity without any shortness of breath or chest pain. He does smoke on the weekends and dips tobacco during the week. No previous heart attacks or strokes.   HPI  Past Medical History  Diagnosis Date  . Arthritis   . Thyroid disease   . Diverticulitis     Past Surgical History  Procedure Laterality Date  . Colon surgery    . Esophagogastroduodenoscopy N/A 11/18/2014    Procedure: ESOPHAGOGASTRODUODENOSCOPY (EGD); Surgeon: Brian CullensPaul Y Oh, MD; Location[facility] ENDOSCOPY; Service: Endoscopy; Laterality: N/A;    Family History  Problem Relation Age of Onset  . Diabetes Brother   . Esophageal cancer Maternal Uncle     Social History Social History  Substance Use Topics  . Smoking status: Light Tobacco Smoker  . Smokeless tobacco: Current User    Types: Chew     Comment: 1 can/day  . Alcohol Use: No    Allergies  Allergen Reactions  . Cyclobenzaprine     Other reaction(s): Hallucination  . Augmentin [Amoxicillin-Pot Clavulanate] Itching, Swelling and Rash    Current Outpatient Prescriptions  Medication Sig Dispense Refill  . cetirizine (ZYRTEC) 10 MG tablet Take 10 mg by mouth daily.    Marland Kitchen. HYDROcodone-acetaminophen  (NORCO/VICODIN) 5-325 MG per tablet Take 0.5-1 tablets by mouth every 6 (six) hours as needed for moderate pain.     Marland Kitchen. levothyroxine (SYNTHROID, LEVOTHROID) 88 MCG tablet Take 88 mcg by mouth daily before breakfast.    . nicotine (NICODERM CQ - DOSED IN MG/24 HOURS) 21 mg/24hr patch Place 1 patch (21 mg total) onto the skin daily. Use 21 mg patch for 7 days, use 14 mg patch for 7 days, use 7mg  patch for 7 days. 28 patch 0   No current facility-administered medications for this visit.     Review of Systems A 10 point review of systems was asked and was negative except for the information on the HPI  Physical Exam CONSTITUTIONAL: NAD well developed LYMPH NODES: Lymph nodes in the neck are normal. RESPIRATORY: Lungs are clear. There is normal respiratory effort, with equal breath sounds bilaterally, and without pathologic use of accessory muscles. CARDIOVASCULAR: Heart is regular without murmurs, gallops, or rubs. GI: The abdomen is soft, nontender, and nondistended. Two separate defects on the midline laparotomy scar. And there is a 2 cm defect where the previous colostomy defect last. These are all reducible defects with no evidence of incarceration. THere is no loss of domain There are no palpable masses. There is no hepatosplenomegaly. There are normal bowel sounds in all quadrants. GU: Rectal deferred.  MUSCULOSKELETAL: Normal muscle strength and tone. No cyanosis or edema.  SKIN: Turgor is good and there are no pathologic skin lesions or ulcers. NEUROLOGIC: Motor and sensation is grossly normal. Cranial nerves are grossly intact. PSYCH: Oriented to person, place and  time. Affect is normal.  Data Reviewed CT scan personally reviewed and confirmed by physical findings of three ventral hernia defects   Assessment/Plan 50 year old with recurrent ventral hernias there are complex. There are 2 defects along the midline laparotomy incision and another defect where  the ostomy site .  I do think he might be a good candidate for a laparoscopic ventral hernia repair with mesh. I have explained the procedure, risks, and aftercare of  hernia repair to Brian Lang.   Risks include but are not limited to bleeding, infection, wound problems, anesthesia, recurrence, bladder or intestine injury, urinary retention, chronic pain, mesh problems.  He  seems to understand and agrees to proceed.  Questions were answered to his stated satisfaction. He understands and I also discussed with him the postoperative care, outcomes and expectations.

## 2015-07-29 NOTE — Telephone Encounter (Signed)
Pt advised of pre op date/time and sx date. Sx: 08/26/15 with Dr Pabon--Laparoscopic ventral hernia repair with mesh. Pre op: 08/16/15 @ 8:15 am-- Office.   Patient made aware to call [phone], between 1-3:00pm the day before surgery, to find out what time to arrive.

## 2015-07-31 ENCOUNTER — Other Ambulatory Visit: Payer: Self-pay | Admitting: Gastroenterology

## 2015-08-06 ENCOUNTER — Telehealth: Payer: Self-pay | Admitting: Surgery

## 2015-08-06 MED ORDER — NICOTINE 7 MG/24HR TD PT24: 7.0000 mg | MEDICATED_PATCH | Freq: Every day | TRANSDERMAL | Status: DC

## 2015-08-06 MED ORDER — NICOTINE 14 MG/24HR TD PT24: 14.0000 mg | MEDICATED_PATCH | Freq: Every day | TRANSDERMAL | Status: DC

## 2015-08-06 NOTE — Telephone Encounter (Signed)
Patient has called and stated that he needs his second step of his Nicotine  21 mg, Transdermal, Daily, Use 21 mg patch for 7 days, use 14 mg patch for 7 days, use 7mg  patch for 7 days.    He states that the order was put in under Dr Servando SnareWohl, however Dr Everlene FarrierPabon prescribed this. He states that [facility] has sent several request to have this filled but it is to the wrong provider. Please call patient once this is fixed so that he will know when he can get this from the pharmacy.

## 2015-08-06 NOTE — Telephone Encounter (Signed)
Patient notified of Nicotine patches sent to pharmacy.

## 2015-08-12 ENCOUNTER — Telehealth: Payer: Self-pay | Admitting: Surgery

## 2015-08-12 NOTE — Telephone Encounter (Signed)
I spoke with patient to let him know the nicotene patch request was sent to the pharmacy last week. I also verified that pharmacy received and confirmed. I asked patient to please call pharmacy and let them know this and that  I am unable to reach them. I have made 3 attempts to speak to someone in the pharmacy and once they answer they hang up on me. I am not sure they can hear me.

## 2015-08-12 NOTE — Telephone Encounter (Signed)
Called patient back to let him know that I spoke to the pharmacist at [facility] and that they will take care of the patient's prescription. I told patient that if he continued to have problem, to please [facility].

## 2015-08-12 NOTE — Telephone Encounter (Signed)
Patient has been waiting for a refill to be [facility] into [facility] [address] In [place]. He was told it was already done. He stated someone told him Dr. Servando SnareWohl but he doesn't know him. Nicotine Patches (he was suppose to go down to the next level.)

## 2015-08-16 ENCOUNTER — Other Ambulatory Visit: Payer: BC Managed Care – PPO

## 2015-08-19 ENCOUNTER — Ambulatory Visit
Admission: RE | Admit: 2015-08-19 | Discharge: 2015-08-19 | Disposition: A | Discharge status: Home / Self Care | Payer: BC Managed Care – PPO | Source: Ambulatory Visit | Attending: Surgery | Admitting: Surgery

## 2015-08-19 DIAGNOSIS — Z01818 Encounter for other preprocedural examination: Secondary | ICD-10-CM | POA: Insufficient documentation

## 2015-08-19 HISTORY — DX: Functional dyspepsia: K30

## 2015-08-19 HISTORY — DX: Hypothyroidism, unspecified: E03.9

## 2015-08-19 NOTE — Patient Instructions (Signed)
  Your procedure is scheduled on: 08/26/15 Thurs Report to Same Day Surgery 2nd floor medical mall To find out your arrival time please call [phone] between 1PM - 3PM on 08/25/15 Wed  Remember: Instructions that are not followed completely may result in serious medical risk, up to and including death, or upon the discretion of your surgeon and anesthesiologist your surgery may need to be rescheduled.    _x___ 1. Do not eat food or drink liquids after midnight. No gum chewing or hard candies.     __x__ 2. No Alcohol for 24 hours before or after surgery.   ____ 3. Bring all medications with you on the day of surgery if instructed.    __x__ 4. Notify your doctor if there is any change in your medical condition     (cold, fever, infections).     Do not wear jewelry, make-up, hairpins, clips or nail polish.  Do not wear lotions, powders, or perfumes. You may wear deodorant.  Do not shave 48 hours prior to surgery. Men may shave face and neck.  Do not bring valuables to the hospital.    [facility] is not responsible for any belongings or valuables.               Contacts, dentures or bridgework may not be worn into surgery.  Leave your suitcase in the car. After surgery it may be brought to your room.  For patients admitted to the hospital, discharge time is determined by your treatment team.   Patients discharged the day of surgery will not be allowed to drive home.    Please read over the following fact sheets that you were given:   [facility] Preparing for Surgery and or MRSA Information   _x___ Take these medicines the morning of surgery with A SIP OF WATER:    1. levothyroxine (SYNTHROID, LEVOTHROID) 88 MCG tablet  2.  3.  4.  5.  6.  ____ Fleet Enema (as directed)   _x___ Use CHG Soap or sage wipes as directed on instruction sheet   ____ Use inhalers on the day of surgery and bring to hospital day of surgery  ____ Stop metformin 2 days prior to surgery    ____  Take 1/2 of usual insulin dose the night before surgery and none on the morning of           surgery.   ____ Stop aspirin or coumadin, or plavix  _x__ Stop Anti-inflammatories such as Advil, Aleve, Ibuprofen, Motrin, Naproxen,          Naprosyn, Goodies powders or aspirin products. Ok to take Tylenol.   ____ Stop supplements until after surgery.    ____ Bring C-Pap to the hospital.

## 2015-08-26 ENCOUNTER — Observation Stay
Admission: RE | Admit: 2015-08-26 | Discharge: 2015-08-27 | Disposition: A | Discharge status: Home / Self Care | Payer: BC Managed Care – PPO | Source: Ambulatory Visit | Attending: Surgery | Admitting: Surgery

## 2015-08-26 ENCOUNTER — Inpatient Hospital Stay: Payer: BC Managed Care – PPO | Admitting: Certified Registered Nurse Anesthetist

## 2015-08-26 ENCOUNTER — Encounter
Admission: RE | Disposition: A | Discharge status: Home / Self Care | Payer: Self-pay | Source: Ambulatory Visit | Attending: Surgery

## 2015-08-26 DIAGNOSIS — M1990 Unspecified osteoarthritis, unspecified site: Secondary | ICD-10-CM | POA: Diagnosis not present

## 2015-08-26 DIAGNOSIS — Z800 Family history of malignant neoplasm of digestive organs: Secondary | ICD-10-CM | POA: Diagnosis not present

## 2015-08-26 DIAGNOSIS — F17220 Nicotine dependence, chewing tobacco, uncomplicated: Secondary | ICD-10-CM | POA: Insufficient documentation

## 2015-08-26 DIAGNOSIS — E039 Hypothyroidism, unspecified: Secondary | ICD-10-CM | POA: Insufficient documentation

## 2015-08-26 DIAGNOSIS — Z9889 Other specified postprocedural states: Secondary | ICD-10-CM | POA: Insufficient documentation

## 2015-08-26 DIAGNOSIS — K432 Incisional hernia without obstruction or gangrene: Secondary | ICD-10-CM | POA: Diagnosis not present

## 2015-08-26 DIAGNOSIS — Z833 Family history of diabetes mellitus: Secondary | ICD-10-CM | POA: Insufficient documentation

## 2015-08-26 DIAGNOSIS — Z8719 Personal history of other diseases of the digestive system: Secondary | ICD-10-CM

## 2015-08-26 HISTORY — PX: VENTRAL HERNIA REPAIR: SHX424

## 2015-08-26 LAB — CBC
HEMATOCRIT: 42.7 % (ref 40.0–52.0)
HEMOGLOBIN: 14.5 g/dL (ref 13.0–18.0)
MCH: 30.1 pg (ref 26.0–34.0)
MCHC: 34.1 g/dL (ref 32.0–36.0)
MCV: 88.4 fL (ref 80.0–100.0)
Platelets: 201 10*3/uL (ref 150–440)
RBC: 4.83 MIL/uL (ref 4.40–5.90)
RDW: 13.5 % (ref 11.5–14.5)
WBC: 14.7 10*3/uL — AB (ref 3.8–10.6)

## 2015-08-26 LAB — CREATININE, SERUM: Creatinine, Ser: 1.32 mg/dL — ABNORMAL HIGH (ref 0.61–1.24)

## 2015-08-26 SURGERY — REPAIR, HERNIA, VENTRAL, LAPAROSCOPIC
Anesthesia: General | Wound class: Clean

## 2015-08-26 MED ORDER — METHOCARBAMOL 500 MG PO TABS: 500.0000 mg | ORAL_TABLET | Freq: Four times a day (QID) | ORAL | Status: DC | PRN

## 2015-08-26 MED ORDER — NICOTINE 7 MG/24HR TD PT24
7.0000 mg | MEDICATED_PATCH | Freq: Every day | TRANSDERMAL | Status: DC
Start: 2015-08-26 — End: 2015-08-27
  Administered 2015-08-26 – 2015-08-27 (×2): 7 mg via TRANSDERMAL
  Filled 2015-08-26 (×2): qty 1

## 2015-08-26 MED ORDER — ONDANSETRON HCL 4 MG/2ML IJ SOLN: 4.0000 mg | Freq: Once | INTRAMUSCULAR | Status: DC | PRN

## 2015-08-26 MED ORDER — FENTANYL CITRATE (PF) 100 MCG/2ML IJ SOLN
INTRAMUSCULAR | Status: DC | PRN
  Administered 2015-08-26 (×5): 50 ug via INTRAVENOUS

## 2015-08-26 MED ORDER — LACTATED RINGERS IV SOLN
INTRAVENOUS | Status: DC
  Administered 2015-08-26 (×2): via INTRAVENOUS

## 2015-08-26 MED ORDER — HEPARIN SODIUM (PORCINE) 5000 UNIT/ML IJ SOLN
5000.0000 [IU] | Freq: Once | INTRAMUSCULAR | Status: AC
  Administered 2015-08-26: 5000 [IU] via SUBCUTANEOUS

## 2015-08-26 MED ORDER — KETOROLAC TROMETHAMINE 30 MG/ML IJ SOLN
INTRAMUSCULAR | Status: DC | PRN
  Administered 2015-08-26: 30 mg via INTRAVENOUS

## 2015-08-26 MED ORDER — PROPOFOL 10 MG/ML IV BOLUS
INTRAVENOUS | Status: DC | PRN
Start: 2015-08-26 — End: 2015-08-26
  Administered 2015-08-26: 200 mg via INTRAVENOUS

## 2015-08-26 MED ORDER — LEVOTHYROXINE SODIUM 88 MCG PO TABS
88.0000 ug | ORAL_TABLET | Freq: Every day | ORAL | Status: DC
  Administered 2015-08-27: 88 ug via ORAL
  Filled 2015-08-26: qty 1

## 2015-08-26 MED ORDER — DIPHENHYDRAMINE HCL 12.5 MG/5ML PO ELIX: 12.5000 mg | ORAL_SOLUTION | Freq: Four times a day (QID) | ORAL | Status: DC | PRN

## 2015-08-26 MED ORDER — ROCURONIUM BROMIDE 100 MG/10ML IV SOLN
INTRAVENOUS | Status: DC | PRN
  Administered 2015-08-26: 5 mg via INTRAVENOUS
  Administered 2015-08-26: 20 mg via INTRAVENOUS
  Administered 2015-08-26: 10 mg via INTRAVENOUS
  Administered 2015-08-26: 15 mg via INTRAVENOUS

## 2015-08-26 MED ORDER — CEFAZOLIN SODIUM-DEXTROSE 2-4 GM/100ML-% IV SOLN
INTRAVENOUS | Status: AC
  Filled 2015-08-26: qty 100

## 2015-08-26 MED ORDER — FAMOTIDINE 20 MG PO TABS
ORAL_TABLET | ORAL | Status: AC
  Administered 2015-08-26: 20 mg via ORAL
  Filled 2015-08-26: qty 1

## 2015-08-26 MED ORDER — MIDAZOLAM HCL 2 MG/2ML IJ SOLN: INTRAMUSCULAR | Status: DC | PRN

## 2015-08-26 MED ORDER — SUGAMMADEX SODIUM 200 MG/2ML IV SOLN
INTRAVENOUS | Status: DC | PRN
  Administered 2015-08-26: 160 mg via INTRAVENOUS

## 2015-08-26 MED ORDER — ONDANSETRON HCL 4 MG/2ML IJ SOLN
INTRAMUSCULAR | Status: DC | PRN
  Administered 2015-08-26: 4 mg via INTRAVENOUS

## 2015-08-26 MED ORDER — DIPHENHYDRAMINE HCL 50 MG/ML IJ SOLN: 12.5000 mg | Freq: Four times a day (QID) | INTRAMUSCULAR | Status: DC | PRN

## 2015-08-26 MED ORDER — DEXTROSE-NACL 5-0.9 % IV SOLN
INTRAVENOUS | Status: DC
  Administered 2015-08-26: 1000 mL via INTRAVENOUS

## 2015-08-26 MED ORDER — ACETAMINOPHEN 10 MG/ML IV SOLN
INTRAVENOUS | Status: DC | PRN
  Administered 2015-08-26: 1000 mg via INTRAVENOUS

## 2015-08-26 MED ORDER — ONDANSETRON HCL 4 MG/2ML IJ SOLN: 4.0000 mg | Freq: Four times a day (QID) | INTRAMUSCULAR | Status: DC | PRN

## 2015-08-26 MED ORDER — HYDROMORPHONE HCL 1 MG/ML IJ SOLN
0.5000 mg | INTRAMUSCULAR | Status: DC | PRN
  Administered 2015-08-26 (×2): 0.5 mg via INTRAVENOUS

## 2015-08-26 MED ORDER — BUPIVACAINE LIPOSOME 1.3 % IJ SUSP
INTRAMUSCULAR | Status: AC
  Filled 2015-08-26: qty 20

## 2015-08-26 MED ORDER — FAMOTIDINE 20 MG PO TABS
20.0000 mg | ORAL_TABLET | Freq: Once | ORAL | Status: AC
  Administered 2015-08-26: 20 mg via ORAL

## 2015-08-26 MED ORDER — ONDANSETRON 8 MG PO TBDP: 4.0000 mg | ORAL_TABLET | Freq: Four times a day (QID) | ORAL | Status: DC | PRN

## 2015-08-26 MED ORDER — CHLORHEXIDINE GLUCONATE 4 % EX LIQD: 1.0000 "application " | Freq: Once | CUTANEOUS | Status: DC

## 2015-08-26 MED ORDER — KETAMINE HCL 50 MG/ML IJ SOLN
INTRAMUSCULAR | Status: DC | PRN
  Administered 2015-08-26: 50 mg via INTRAMUSCULAR

## 2015-08-26 MED ORDER — SUCCINYLCHOLINE CHLORIDE 20 MG/ML IJ SOLN
INTRAMUSCULAR | Status: DC | PRN
  Administered 2015-08-26: 120 mg via INTRAVENOUS

## 2015-08-26 MED ORDER — EPHEDRINE SULFATE 50 MG/ML IJ SOLN
INTRAMUSCULAR | Status: DC | PRN
  Administered 2015-08-26 (×4): 5 mg via INTRAVENOUS

## 2015-08-26 MED ORDER — GABAPENTIN 300 MG PO CAPS
400.0000 mg | ORAL_CAPSULE | Freq: Three times a day (TID) | ORAL | Status: DC
  Administered 2015-08-26 – 2015-08-27 (×3): 400 mg via ORAL
  Filled 2015-08-26 (×3): qty 1

## 2015-08-26 MED ORDER — FENTANYL CITRATE (PF) 100 MCG/2ML IJ SOLN
INTRAMUSCULAR | Status: AC
  Filled 2015-08-26: qty 2

## 2015-08-26 MED ORDER — LIDOCAINE HCL (CARDIAC) 20 MG/ML IV SOLN
INTRAVENOUS | Status: DC | PRN
  Administered 2015-08-26: 50 mg via INTRAVENOUS

## 2015-08-26 MED ORDER — KETOROLAC TROMETHAMINE 30 MG/ML IJ SOLN
30.0000 mg | Freq: Four times a day (QID) | INTRAMUSCULAR | Status: DC
  Administered 2015-08-26 – 2015-08-27 (×4): 30 mg via INTRAVENOUS
  Filled 2015-08-26 (×4): qty 1

## 2015-08-26 MED ORDER — ACETAMINOPHEN 500 MG PO TABS
1000.0000 mg | ORAL_TABLET | Freq: Four times a day (QID) | ORAL | Status: DC
  Administered 2015-08-26 – 2015-08-27 (×4): 1000 mg via ORAL
  Filled 2015-08-26 (×4): qty 2

## 2015-08-26 MED ORDER — FAMOTIDINE IN NACL 20-0.9 MG/50ML-% IV SOLN
20.0000 mg | Freq: Two times a day (BID) | INTRAVENOUS | Status: DC
  Administered 2015-08-26 (×2): 20 mg via INTRAVENOUS
  Filled 2015-08-26 (×4): qty 50

## 2015-08-26 MED ORDER — ACETAMINOPHEN 10 MG/ML IV SOLN
INTRAVENOUS | Status: AC
  Filled 2015-08-26: qty 100

## 2015-08-26 MED ORDER — CLINDAMYCIN PHOSPHATE 900 MG/50ML IV SOLN
INTRAVENOUS | Status: AC
  Administered 2015-08-26: 900 mg via INTRAVENOUS
  Filled 2015-08-26: qty 50

## 2015-08-26 MED ORDER — DEXAMETHASONE SODIUM PHOSPHATE 10 MG/ML IJ SOLN
INTRAMUSCULAR | Status: DC | PRN
  Administered 2015-08-26: 10 mg via INTRAVENOUS

## 2015-08-26 MED ORDER — CLINDAMYCIN PHOSPHATE 900 MG/50ML IV SOLN
900.0000 mg | Freq: Once | INTRAVENOUS | Status: AC
  Administered 2015-08-26: 900 mg via INTRAVENOUS

## 2015-08-26 MED ORDER — ENOXAPARIN SODIUM 40 MG/0.4ML ~~LOC~~ SOLN
40.0000 mg | SUBCUTANEOUS | Status: DC
  Administered 2015-08-27: 40 mg via SUBCUTANEOUS
  Filled 2015-08-26: qty 0.4

## 2015-08-26 MED ORDER — FENTANYL CITRATE (PF) 100 MCG/2ML IJ SOLN
25.0000 ug | INTRAMUSCULAR | Status: DC | PRN
Start: 2015-08-26 — End: 2015-08-26
  Administered 2015-08-26 (×3): 50 ug via INTRAVENOUS

## 2015-08-26 MED ORDER — BUPIVACAINE HCL 0.25 % IJ SOLN
INTRAMUSCULAR | Status: DC | PRN
  Administered 2015-08-26: 30 mL

## 2015-08-26 MED ORDER — HEPARIN SODIUM (PORCINE) 5000 UNIT/ML IJ SOLN
INTRAMUSCULAR | Status: AC
  Administered 2015-08-26: 5000 [IU] via SUBCUTANEOUS
  Filled 2015-08-26: qty 1

## 2015-08-26 MED ORDER — CEFAZOLIN SODIUM-DEXTROSE 2-4 GM/100ML-% IV SOLN: 2.0000 g | INTRAVENOUS | Status: DC

## 2015-08-26 MED ORDER — SODIUM CHLORIDE 0.9 % IJ SOLN
INTRAMUSCULAR | Status: AC
  Filled 2015-08-26: qty 50

## 2015-08-26 MED ORDER — HYDROMORPHONE HCL 1 MG/ML IJ SOLN
INTRAMUSCULAR | Status: AC
  Filled 2015-08-26: qty 1

## 2015-08-26 MED ORDER — MIDAZOLAM HCL 2 MG/2ML IJ SOLN
INTRAMUSCULAR | Status: DC | PRN
  Administered 2015-08-26: 2 mg via INTRAVENOUS

## 2015-08-26 MED ORDER — SODIUM CHLORIDE 0.9 % IV SOLN
INTRAVENOUS | Status: DC | PRN
  Administered 2015-08-26: 70 mL

## 2015-08-26 MED ORDER — OXYCODONE HCL 5 MG PO TABS
5.0000 mg | ORAL_TABLET | ORAL | Status: DC | PRN
  Administered 2015-08-26 – 2015-08-27 (×3): 10 mg via ORAL
  Filled 2015-08-26 (×3): qty 2

## 2015-08-26 MED ORDER — HYDROMORPHONE HCL 1 MG/ML IJ SOLN
0.5000 mg | INTRAMUSCULAR | Status: DC | PRN
Start: 2015-08-26 — End: 2015-08-27
  Administered 2015-08-26 – 2015-08-27 (×3): 0.5 mg via INTRAVENOUS
  Filled 2015-08-26 (×3): qty 1

## 2015-08-26 SURGICAL SUPPLY — 35 items
APPLICATOR COTTON TIP 6IN STRL (MISCELLANEOUS) ×9 IMPLANT
CANISTER SUCT 1200ML W/VALVE (MISCELLANEOUS) ×3 IMPLANT
CHLORAPREP W/TINT 26ML (MISCELLANEOUS) ×3 IMPLANT
DEVICE SECURE STRAP 25 ABSORB (INSTRUMENTS) ×9 IMPLANT
DRAPE INCISE IOBAN 66X45 STRL (DRAPES) ×6 IMPLANT
GLOVE BIO SURGEON STRL SZ7 (GLOVE) ×27 IMPLANT
GOWN STRL REUS W/ TWL LRG LVL3 (GOWN DISPOSABLE) ×5 IMPLANT
GOWN STRL REUS W/TWL LRG LVL3 (GOWN DISPOSABLE) ×10
GRASPER SUT TROCAR 14GX15 (MISCELLANEOUS) ×3 IMPLANT
HANDLE YANKAUER SUCT BULB TIP (MISCELLANEOUS) ×3 IMPLANT
IRRIGATION STRYKERFLOW (MISCELLANEOUS) IMPLANT
IRRIGATOR STRYKERFLOW (MISCELLANEOUS)
IV NS 1000ML (IV SOLUTION)
IV NS 1000ML BAXH (IV SOLUTION) IMPLANT
L-HOOK LAP DISP 36CM (ELECTROSURGICAL) ×3
LHOOK LAP DISP 36CM (ELECTROSURGICAL) ×1 IMPLANT
LIQUID BAND (GAUZE/BANDAGES/DRESSINGS) ×6 IMPLANT
MESH VENTRALIGHT ST 8X10 (Mesh General) ×3 IMPLANT
NDL INSUFF ACCESS 14 VERSASTEP (NEEDLE) ×3 IMPLANT
NDL SAFETY 25GX1.5 (NEEDLE) ×3 IMPLANT
NS IRRIG 500ML POUR BTL (IV SOLUTION) ×3 IMPLANT
PACK LAP CHOLECYSTECTOMY (MISCELLANEOUS) ×3 IMPLANT
PAD GROUND ADULT SPLIT (MISCELLANEOUS) ×3 IMPLANT
PENCIL ELECTRO HAND CTR (MISCELLANEOUS) ×3 IMPLANT
SLEEVE ENDOPATH XCEL 5M (ENDOMECHANICALS) ×3 IMPLANT
SPONGE LAP 18X18 5 PK (GAUZE/BANDAGES/DRESSINGS) ×3 IMPLANT
SUT ETHIBOND 0 MO6 C/R (SUTURE) ×6 IMPLANT
SUT MNCRL AB 4-0 PS2 18 (SUTURE) ×3 IMPLANT
SUT VIC AB 0 SH 27 (SUTURE) ×6 IMPLANT
SUT VICRYL 0 AB UR-6 (SUTURE) ×3 IMPLANT
TOWEL OR 17X26 4PK STRL BLUE (TOWEL DISPOSABLE) ×3 IMPLANT
TRAY FOLEY W/METER SILVER 16FR (SET/KITS/TRAYS/PACK) ×3 IMPLANT
TROCAR XCEL NON-BLD 11X100MML (ENDOMECHANICALS) ×3 IMPLANT
TROCAR XCEL NON-BLD 5MMX100MML (ENDOMECHANICALS) ×6 IMPLANT
TUBING INSUFFLATOR HI FLOW (MISCELLANEOUS) ×3 IMPLANT

## 2015-08-26 NOTE — H&P (View-Only) (Signed)
HPI Brian D Brian Lang is a 50 y.o. male referred by Dr. Shelle Ironein for recurrent ventral hernia. Initially patient had a perforated diverticulitis requiring a Hartmann's procedure in an outside hospital [place] he was out of town. This was close to 2 years ago. Most recently the patient had a [facility] about a year and a half ago without complications. Now comes with recurrence of multiple defects 2 along the midline laparotomy incision and another one on the ostomy defect. He reports some intermittent discomfort and mild dull pain but no evidence of incarceration or obstruction. He has good cardiovascular reserve and is able to perform more than 4 Mets of activity without any shortness of breath or chest pain. He does smoke on the weekends and dips tobacco during the week. No previous heart attacks or strokes.   HPI  Past Medical History  Diagnosis Date  . Arthritis   . Thyroid disease   . Diverticulitis     Past Surgical History  Procedure Laterality Date  . Colon surgery    . Esophagogastroduodenoscopy N/A 11/18/2014    Procedure: ESOPHAGOGASTRODUODENOSCOPY (EGD); Surgeon: Wallace CullensPaul Y Oh, MD; [facility]; Service: Endoscopy; Laterality: N/A;    Family History  Problem Relation Age of Onset  . Diabetes Brother   . Esophageal cancer Maternal Uncle     Social History Social History  Substance Use Topics  . Smoking status: Light Tobacco Smoker  . Smokeless tobacco: Current User    Types: Chew     Comment: 1 can/day  . Alcohol Use: No    Allergies  Allergen Reactions  . Cyclobenzaprine     Other reaction(s): Hallucination  . Augmentin [Amoxicillin-Pot Clavulanate] Itching, Swelling and Rash    Current Outpatient Prescriptions  Medication Sig Dispense Refill  . cetirizine (ZYRTEC) 10 MG tablet Take 10 mg by mouth daily.    Marland Kitchen. HYDROcodone-acetaminophen  (NORCO/VICODIN) 5-325 MG per tablet Take 0.5-1 tablets by mouth every 6 (six) hours as needed for moderate pain.     Marland Kitchen. levothyroxine (SYNTHROID, LEVOTHROID) 88 MCG tablet Take 88 mcg by mouth daily before breakfast.    . nicotine (NICODERM CQ - DOSED IN MG/24 HOURS) 21 mg/24hr patch Place 1 patch (21 mg total) onto the skin daily. Use 21 mg patch for 7 days, use 14 mg patch for 7 days, use 7mg  patch for 7 days. 28 patch 0   No current facility-administered medications for this visit.     Review of Systems A 10 point review of systems was asked and was negative except for the information on the HPI  Physical Exam CONSTITUTIONAL: NAD well developed LYMPH NODES: Lymph nodes in the neck are normal. RESPIRATORY: Lungs are clear. There is normal respiratory effort, with equal breath sounds bilaterally, and without pathologic use of accessory muscles. CARDIOVASCULAR: Heart is regular without murmurs, gallops, or rubs. GI: The abdomen is soft, nontender, and nondistended. Two separate defects on the midline laparotomy scar. And there is a 2 cm defect where the previous colostomy defect last. These are all reducible defects with no evidence of incarceration. THere is no loss of domain There are no palpable masses. There is no hepatosplenomegaly. There are normal bowel sounds in all quadrants. GU: Rectal deferred.  MUSCULOSKELETAL: Normal muscle strength and tone. No cyanosis or edema.  SKIN: Turgor is good and there are no pathologic skin lesions or ulcers. NEUROLOGIC: Motor and sensation is grossly normal. Cranial nerves are grossly intact. PSYCH: Oriented to person, place and  time. Affect is normal.  Data Reviewed CT scan personally reviewed and confirmed by physical findings of three ventral hernia defects   Assessment/Plan 50 year old with recurrent ventral hernias there are complex. There are 2 defects along the midline laparotomy incision and another defect where  the ostomy site .  I do think he might be a good candidate for a laparoscopic ventral hernia repair with mesh. I have explained the procedure, risks, and aftercare of  hernia repair to Brian Lang.   Risks include but are not limited to bleeding, infection, wound problems, anesthesia, recurrence, bladder or intestine injury, urinary retention, chronic pain, mesh problems.  He  seems to understand and agrees to proceed.  Questions were answered to his stated satisfaction. He understands and I also discussed with him the postoperative care, outcomes and expectations.

## 2015-08-26 NOTE — Op Note (Signed)
Abdominal Hernia Repair  Pre-operative Diagnosis: Complex ventral hernia with multiple abdominal wall defects  Post-operative Diagnosis: Same  Surgeon: Sterling Bigiego Colman Birdwell, MD FACS  Procedures: 1. Extensive laparoscopic lysis of adhesions taking at least 80 minutes of total operative time 2. Laparoscopic ventral repair using 25 x 20 cm mesh ventralight BARD  Anesthesia: Gen. with endotracheal tube   Findings: Complex Hernia with two Midline small  Swiss cheese defects, and a 4 cm defect from the previous colostomy site on the Left side of the abdominal wall Extensive and dense adhesions from the omentum to the abdominal wall and from the small bowel to the abdominal wall Adequate repair with at least 5 cm overlap between the edge of the defect and the mesh  Estimated Blood Loss: 25cc         Drains: none         Specimens: sac          Complications: none             Procedure Details  The patient was seen again in the Holding Room. The benefits, complications, treatment options, and expected outcomes were discussed with the patient. The risks of bleeding, infection, recurrence of symptoms, failure to resolve symptoms, bowel injury, mesh placement, mesh infection, any of which could require further surgery were reviewed with the patient. The likelihood of improving the patient's symptoms with return to their baseline status is good.  The patient and/or family concurred with the proposed plan, giving informed consent.  The patient was taken to Operating Room, identified as Brian Lang and the procedure verified.  A Time Out was held and the above information confirmed.  Prior to the induction of general anesthesia, antibiotic prophylaxis was administered. VTE prophylaxis was in place. General endotracheal anesthesia was then administered and tolerated well. After the induction, the abdomen was prepped with Chloraprep and draped in the sterile fashion. The patient was positioned in the supine  position.   Incision was created with a scalpel over the previous colostomy hernia defect. Electrocautery was used to dissect through subcutaneous tissue, the hernia sac was opened and rises of adhesion was performed with Metzenbaum's scissors. Hernia sac was excised.  Interrupted Vicryl sutures were placed on each corner of the mesh and then was inserted through the hernia defect. 4 5 mm ports were placed in the abdominal cavity under direct visualization.The hernia was measured and the mesh was selected. I closed the hernia defect with interrupted 0 Ethibond sutures. Pneumoperitoneum was created and diagnostic laparoscopy was able to confirm the adequate primary repair of the defect. Completion of laparoscopic lysis of adhesions was done with a combination of electrocautery and laparoscopic scissors. Once we have an adequate visualization of the abdominal wall and all the adhesions were taken down were able to advance with the operation.  The mesh was placed in an underlay fashion. 4 trans-fascial sutures were placed and used to anchor the mesh. Using the laparoscopic tacker we anchored the mesh circumferentially to the abdominal wall. The mesh lay really nicely again significant abdominal wall. A second look laparoscopy revealed no evidence of intra-abdominal injury. The laparoscopic ports were removed under direct visualization and the pneumoperitoneum was deflated.  Incisions were closed in a 2 layer fashion with 2-0 Vicryl and 4-0 Monocryl. Dermabond was used to coat the skin. Exparel and Marcaine quarter percent was used to inject all the incision sites. Patient tolerated procedure well and there were no immediate complications. Needle and laparotomy counts were correct  Caroleen Hamman, MD, FACS

## 2015-08-26 NOTE — Anesthesia Procedure Notes (Signed)
Procedure Name: Intubation Date/Time: 08/26/2015 7:44 AM Performed by: Ginger CarneMICHELET, Hether Anselmo Pre-anesthesia Checklist: Patient identified, Emergency Drugs available, Suction available, Patient being monitored and Timeout performed Patient Re-evaluated:Patient Re-evaluated prior to inductionOxygen Delivery Method: Circle system utilized Preoxygenation: Pre-oxygenation with 100% oxygen Intubation Type: IV induction Ventilation: Mask ventilation without difficulty and Oral airway inserted - appropriate to patient size Laryngoscope Size: Hyacinth MeekerMiller, 2, Mac and 4 Grade View: Grade II Tube type: Oral Number of attempts: 2 Airway Equipment and Method: Stylet Placement Confirmation: ETT inserted through vocal cords under direct vision,  positive ETCO2 and breath sounds checked- equal and bilateral Secured at: 22 cm Tube secured with: Tape Dental Injury: Teeth and Oropharynx as per pre-operative assessment  Difficulty Due To: Difficult Airway- due to anterior larynx

## 2015-08-26 NOTE — Interval H&P Note (Signed)
History and Physical Interval Note:  08/26/2015 7:06 AM  Brian Lang [place]  has presented today for surgery, with the diagnosis of VENTRAL HERNIA  The various methods of treatment have been discussed with the patient and family. After consideration of risks, benefits and other options for treatment, the patient has consented to  Procedure(s): LAPAROSCOPIC VENTRAL HERNIA (N/A) EXCISION OF MESH (N/A) as a surgical intervention .  The patient's history has been reviewed, patient examined, no change in status, stable for surgery.  I have reviewed the patient's chart and labs.  Questions were answered to the patient's satisfaction.     Merary Garguilo F Martena Emanuele

## 2015-08-26 NOTE — Anesthesia Postprocedure Evaluation (Signed)
Anesthesia Post Note  Patient: Brian Lang  Procedure(s) Performed: Procedure(s) (LRB): LAPAROSCOPIC VENTRAL HERNIA and insertion of mesh (N/A)  Patient location during evaluation: PACU Anesthesia Type: General Level of consciousness: awake and alert Pain management: pain level controlled Vital Signs Assessment: post-procedure vital signs reviewed and stable Respiratory status: spontaneous breathing, nonlabored ventilation, respiratory function stable and patient connected to nasal cannula oxygen Cardiovascular status: blood pressure returned to baseline and stable Postop Assessment: no signs of nausea or vomiting Anesthetic complications: no    Last Vitals:  Filed Vitals:   08/26/15 1151 08/26/15 1220  BP: 111/73 116/75  Pulse: 79 75  Temp: 36.3 C 36.7 C  Resp:  16    Last Pain:  Filed Vitals:   08/26/15 1236  PainSc: Asleep                 Brian Lang

## 2015-08-26 NOTE — OR Nursing (Signed)
Sacral pad sent to OR 

## 2015-08-26 NOTE — Anesthesia Preprocedure Evaluation (Signed)
Anesthesia Evaluation  Patient identified by MRN, date of birth, ID band Patient awake    Reviewed: Allergy & Precautions, H&P , NPO status , Patient's Chart, lab work & pertinent test results, reviewed documented beta blocker date and time   History of Anesthesia Complications Negative for: history of anesthetic complications  Airway Mallampati: I  TM Distance: >3 FB Neck ROM: full    Dental no notable dental hx. (+) Poor Dentition, Missing   Pulmonary neg shortness of breath, neg sleep apnea, neg COPD, neg recent URI, Current Smoker,    Pulmonary exam normal breath sounds clear to auscultation       Cardiovascular Exercise Tolerance: Good negative cardio ROS Normal cardiovascular exam Rhythm:regular Rate:Normal     Neuro/Psych negative neurological ROS  negative psych ROS   GI/Hepatic negative GI ROS, Neg liver ROS,   Endo/Other  neg diabetesHypothyroidism   Renal/GU negative Renal ROS  negative genitourinary   Musculoskeletal   Abdominal   Peds  Hematology negative hematology ROS (+)   Anesthesia Other Findings Past Medical History:   Arthritis                                                    Thyroid disease                                              Diverticulitis                                               Hypothyroidism                                               Indigestion                                                  Reproductive/Obstetrics negative OB ROS                             Anesthesia Physical Anesthesia Plan  ASA: II  Anesthesia Plan: General   Post-op Pain Management:    Induction:   Airway Management Planned:   Additional Equipment:   Intra-op Plan:   Post-operative Plan:   Informed Consent: I have reviewed the patients History and Physical, chart, labs and discussed the procedure including the risks, benefits and alternatives for the  proposed anesthesia with the patient or authorized representative who has indicated his/her understanding and acceptance.   Dental Advisory Given  Plan Discussed with: Anesthesiologist, CRNA and Surgeon  Anesthesia Plan Comments:         Anesthesia Quick Evaluation

## 2015-08-26 NOTE — Transfer of Care (Signed)
Immediate Anesthesia Transfer of Care Note  Patient: Brian Lang  Procedure(s) Performed: Procedure(s): LAPAROSCOPIC VENTRAL HERNIA and insertion of mesh (N/A)  Patient Location: PACU  Anesthesia Type:General  Level of Consciousness: awake and alert   Airway & Oxygen Therapy: Patient Spontanous Breathing and Patient connected to face mask oxygen  Post-op Assessment: Report given to RN and Post -op Vital signs reviewed and stable  Post vital signs: Reviewed and stable  Last Vitals:  Filed Vitals:   08/26/15 0633 08/26/15 1106  BP: 119/79 142/97  Pulse: 50 81  Temp: 36.4 C 36.1 C  Resp: 14 13    Last Pain: There were no vitals filed for this visit.       Complications: No apparent anesthesia complications

## 2015-08-27 ENCOUNTER — Telehealth: Payer: Self-pay | Admitting: Surgery

## 2015-08-27 DIAGNOSIS — K432 Incisional hernia without obstruction or gangrene: Secondary | ICD-10-CM | POA: Diagnosis not present

## 2015-08-27 LAB — BASIC METABOLIC PANEL
Anion gap: 9 (ref 5–15)
BUN: 12 mg/dL (ref 6–20)
CALCIUM: 8.5 mg/dL — AB (ref 8.9–10.3)
CO2: 22 mmol/L (ref 22–32)
Chloride: 105 mmol/L (ref 101–111)
Creatinine, Ser: 1.08 mg/dL (ref 0.61–1.24)
GFR calc Af Amer: >60 mL/min (ref >60)
Glucose, Bld: 103 mg/dL — ABNORMAL HIGH (ref 65–99)
POTASSIUM: 4.3 mmol/L (ref 3.5–5.1)
SODIUM: 136 mmol/L (ref 135–145)

## 2015-08-27 LAB — CBC
HEMATOCRIT: 39.9 % — AB (ref 40.0–52.0)
Hemoglobin: 13.6 g/dL (ref 13.0–18.0)
MCH: 30.2 pg (ref 26.0–34.0)
MCHC: 34.1 g/dL (ref 32.0–36.0)
MCV: 88.5 fL (ref 80.0–100.0)
PLATELETS: 193 10*3/uL (ref 150–440)
RBC: 4.5 MIL/uL (ref 4.40–5.90)
RDW: 13.6 % (ref 11.5–14.5)
WBC: 13.8 10*3/uL — AB (ref 3.8–10.6)

## 2015-08-27 LAB — SURGICAL PATHOLOGY

## 2015-08-27 MED ORDER — GABAPENTIN 400 MG PO CAPS: 400.0000 mg | ORAL_CAPSULE | Freq: Three times a day (TID) | ORAL | Status: DC

## 2015-08-27 MED ORDER — NICOTINE 7 MG/24HR TD PT24: 7.0000 mg | MEDICATED_PATCH | Freq: Every day | TRANSDERMAL | Status: DC

## 2015-08-27 MED ORDER — METHOCARBAMOL 500 MG PO TABS: 500.0000 mg | ORAL_TABLET | Freq: Four times a day (QID) | ORAL | Status: DC | PRN

## 2015-08-27 MED ORDER — OXYCODONE-ACETAMINOPHEN 10-325 MG PO TABS: 1.0000 | ORAL_TABLET | ORAL | Status: DC | PRN

## 2015-08-27 MED ORDER — NICOTINE 14 MG/24HR TD PT24: 14.0000 mg | MEDICATED_PATCH | Freq: Every day | TRANSDERMAL | Status: DC

## 2015-08-27 NOTE — Progress Notes (Signed)
08/27/2015 10:42 AM  BP 127/79 mmHg  Pulse 57  Temp(Src) 97.8 F (36.6 C) (Oral)  Resp 16  Ht 5\' 7"  (1.702 m)  Wt 77.111 kg (170 lb)  BMI 26.62 kg/m2  SpO2 100% Patient discharged per MD orders. Discharge instructions reviewed with patient and patient verbalized understanding. IV removed per policy. Prescriptions discussed and given to patient. Discharged via wheelchair escorted by auxilary.  Ron ParkerHerron, Ravon Mcilhenny D, RN

## 2015-08-27 NOTE — Discharge Summary (Signed)
Patient ID: Brian Lang MRN: [id number] DOB/AGE: 1965-05-15 50 y.o.  Admit date: 08/26/2015 Discharge date: 08/27/2015   Discharge Diagnoses:  Active Problems:   S/P ventral herniorrhaphy   Recurrent ventral hernia   Procedures:Lap ventral hernia  Hospital Course: Complex ventral hernia s/p lap repair w mesh. Did very well pos operatively w/o complications. His PE at DC he was in NAD awake alert, AVSS, abdomen was soft, incision c/d/i, no complications. COndition at DC is stable  Disposition: 01-Home or Self Care  Discharge Instructions    Call MD for:  difficulty breathing, headache or visual disturbances    Complete by:  As directed      Call MD for:  hives    Complete by:  As directed      Call MD for:  persistant dizziness or light-headedness    Complete by:  As directed      Call MD for:  persistant nausea and vomiting    Complete by:  As directed      Call MD for:  redness, tenderness, or signs of infection (pain, swelling, redness, odor or green/yellow discharge around incision site)    Complete by:  As directed      Call MD for:  severe uncontrolled pain    Complete by:  As directed      Call MD for:  temperature >100.4    Complete by:  As directed      Diet - low sodium heart healthy    Complete by:  As directed      Discharge instructions    Complete by:  As directed   Shower tomorrow     Increase activity slowly    Complete by:  As directed      Lifting restrictions    Complete by:  As directed   20 lbs     No wound care    Complete by:  As directed             Medication List    TAKE these medications        cetirizine 10 MG tablet  Commonly known as:  ZYRTEC  Take 10 mg by mouth daily.     gabapentin 400 MG capsule  Commonly known as:  NEURONTIN  Take 1 capsule (400 mg total) by mouth 3 (three) times daily.     HYDROcodone-acetaminophen 5-325 MG tablet  Commonly known as:  NORCO/VICODIN  Take 0.5-1 tablets by mouth every 6 (six) hours as  needed for moderate pain.     levothyroxine 88 MCG tablet  Commonly known as:  SYNTHROID, LEVOTHROID  Take 88 mcg by mouth daily before breakfast.     methocarbamol 500 MG tablet  Commonly known as:  ROBAXIN  Take 1 tablet (500 mg total) by mouth every 6 (six) hours as needed for muscle spasms.     nicotine 21 mg/24hr patch  Commonly known as:  NICODERM CQ - dosed in mg/24 hours  Place 1 patch (21 mg total) onto the skin daily. Use 21 mg patch for 7 days, use 14 mg patch for 7 days, use 7mg  patch for 7 days.     oxyCODONE-acetaminophen 10-325 MG tablet  Commonly known as:  PERCOCET  Take 1 tablet by mouth every 4 (four) hours as needed for pain (May take 2 if pain is severe).           Follow-up Information    Follow up with Leafy Roiego F Pabon, MD In 2 weeks.  Specialty:  General Surgery   Contact information:   [address] [place] [address] [phone]        Sterling Big, MD FACS

## 2015-08-27 NOTE — Discharge Instructions (Signed)
Laparoscopic Ventral Hernia Repair, Care After °Refer to this sheet in the next few weeks. These instructions provide you with information on caring for yourself after your procedure. Your caregiver may also give you more specific instructions. Your treatment has been planned according to current medical practices, but problems sometimes occur. Call your caregiver if you have any problems or questions after your procedure.  °HOME CARE INSTRUCTIONS  °· Only take over-the-counter or prescription medicines as directed by your caregiver. If antibiotic medicines are prescribed, take them as directed. Finish them even if you start to feel better. °· Always wash your hands before touching your abdomen. °· Take your bandages (dressings) off after 48 hours or as directed by your caregiver. You may have skin adhesive strips or skin glue over the surgical cuts (incisions). Do not take the strips off or peel the glue off. These will fall off on their own. °· Take showers once your caregiver approves. Until then, only take sponge baths. Do not take tub baths or go swimming until your caregiver approves. °· Check your incision area every day for swelling, redness, warmth, and blood or fluid leaking from the incision. These are signs of infection. Wash your hands before you check. °· Hold a pillow over your abdomen when you cough or sneeze to help ease pain. °· Eat foods that are high in fiber, such as whole grains, fruits, and vegetables. Fiber helps prevent difficult bowel movements (constipation). °· Drink enough fluids to keep your urine clear or pale yellow. °· Rest and lessen activity for 4-5 days after the surgery. You may take short walks if your caregiver approves. Do not drive until approved by your caregiver. °· Do not lift anything heavy, participate in sports, or have sexual intercourse for 6-8 weeks or until your caregiver approves.   °· Ask your caregiver when you can return to work. °· Keep all follow-up  appointments. °SEEK MEDICAL CARE IF:  °· You have pain even after taking pain medicine. °· You have not had a bowel movement in 3 days. °· You have cramps or are nauseous. °SEEK IMMEDIATE MEDICAL CARE IF:  °· You have pain or swelling that is getting worse. °· You have redness around your incisions. °· Your incision is pulling apart. °· You have blood or fluid leaking from your incisions. °· You are vomiting. °· You cannot pass urine. °MAKE SURE YOU:  °· Understand these instructions. °· Will watch your condition. °· Will get help right away if you are not doing well or get worse. °  °This information is not intended to replace advice given to you by your health care provider. Make sure you discuss any questions you have with your health care provider. °  °Document Released: 02/14/2012 Document Revised: 03/20/2014 Document Reviewed: 02/14/2012 °Elsevier Interactive Patient Education ©2016 [employer] ° °

## 2015-08-27 NOTE — Telephone Encounter (Signed)
Sent to [facility] on [address] at this time.

## 2015-08-27 NOTE — Telephone Encounter (Signed)
Patient was called in the wrong nicotine patch. He needed Step 2 not Step 1 again.  He is now using [facility] [address] Please call

## 2015-08-27 NOTE — Progress Notes (Signed)
Patient A/O, no noted distress. C/o pain administered pain regimen, minimal effective. Patient slept well. Tolerated meds well. Ambulated around the unit "7-8x". Staff will continue to monitor and meet needs. No noted drainage at surgical sites.

## 2015-09-01 ENCOUNTER — Telehealth: Payer: Self-pay

## 2015-09-01 NOTE — Telephone Encounter (Signed)
Called patient to let him know that his FMLA forms were filled out and ready to be faxed. However, I told him that I did not have a fax number from his employer. Patient stated that he will call me tomorrow with that fax number since he did not have it at this time. I told him that it was fine and that I would be waiting. Patient agreed.

## 2015-09-02 NOTE — Telephone Encounter (Signed)
Patient called and gave me his fax number: [phone] to fax his disability paperwork.

## 2015-09-06 ENCOUNTER — Other Ambulatory Visit: Payer: Self-pay | Admitting: Surgery

## 2015-09-06 MED ORDER — OXYCODONE-ACETAMINOPHEN 10-325 MG PO TABS: 1.0000 | ORAL_TABLET | Freq: Four times a day (QID) | ORAL | Status: DC | PRN

## 2015-09-06 NOTE — Telephone Encounter (Signed)
Patient calling again inquiring about pain medication refill.

## 2015-09-06 NOTE — Telephone Encounter (Signed)
Prescription ready for pick-up in [place] office. Called patient at this time and he was given closing time and directions to [place] office. He will be at office by 5pm per patient. Prescription left with Research officer, [employer] desk staff.

## 2015-09-06 NOTE — Telephone Encounter (Signed)
Patient has called and stated that he has taken his last pain pill this morning and is still in pain from his surgery. Patient had a ventral hernia repair with Dr Everlene FarrierPabon on 08/26/15 and was given  oxyCODONE-acetaminophen 10-325 MG tablet  Commonly known as: PERCOCET  Take 1 tablet by mouth every 4 (four) hours as needed for pain (May take 2 if pain is severe).           He would like to know if he can get a refill or enough pain medication to last till his follow up appointment that is on 09/08/15 with Dr Orvis BrillLoflin. Please contact patient on home number or cell phone number that has been verified.

## 2015-09-06 NOTE — Telephone Encounter (Signed)
Spoke with Dr. Tonita CongWoodham at this time. Prescription printed and I am awaiting him to sign prescription.

## 2015-09-08 ENCOUNTER — Encounter: Payer: Self-pay | Admitting: Surgery

## 2015-09-08 ENCOUNTER — Ambulatory Visit (INDEPENDENT_AMBULATORY_CARE_PROVIDER_SITE_OTHER): Payer: BC Managed Care – PPO | Admitting: Surgery

## 2015-09-08 VITALS — BP 128/77 | HR 63 | Temp 98.3°F | Wt 168.0 lb

## 2015-09-08 DIAGNOSIS — K432 Incisional hernia without obstruction or gangrene: Secondary | ICD-10-CM

## 2015-09-08 MED ORDER — HYDROCODONE-ACETAMINOPHEN 5-325 MG PO TABS: 1.0000 | ORAL_TABLET | Freq: Four times a day (QID) | ORAL | Status: AC | PRN

## 2015-09-08 NOTE — Patient Instructions (Addendum)

## 2015-09-08 NOTE — Progress Notes (Signed)
50 yr old s/p ventral hernia repair with Dr. Everlene FarrierPabon on 6/11. Patien did well but states he has had some significant pain and still can't bend fully to tie his shoes.  He states he's eating well and having good BMs.  The pain medication helps with the pain, we discussed using Naproxin and Ibuprofen temporarily to help with pain as well.   Filed Vitals:   09/08/15 1048  BP: 128/77  Pulse: 63  Temp: 98.3 F (36.8 C)   PE;  Gen: NAD  Abd: soft, incisions c/d/i, no erythema or drainage    A/P: healing well from Ventral hernia, gave work excuse note to RTC with lifting restrictions on 7/10  and then could be released from restrictions at 6 weeks. Patient is to f/u prn.

## 2015-09-10 ENCOUNTER — Encounter: Payer: Self-pay | Admitting: Surgery

## 2016-09-03 IMAGING — CR DG CHEST 1V
1 series · 1 of 1 positions shown · non-contrast
Comparison: No priors.

CLINICAL DATA: 49-year-old male with dark stool 3 days ago and dark
emesis 2 days ago, complaining of intermittent mid to upper
abdominal pain and epigastric pain for the past 3 days.

EXAM:
CHEST  1 VIEW

[portable]
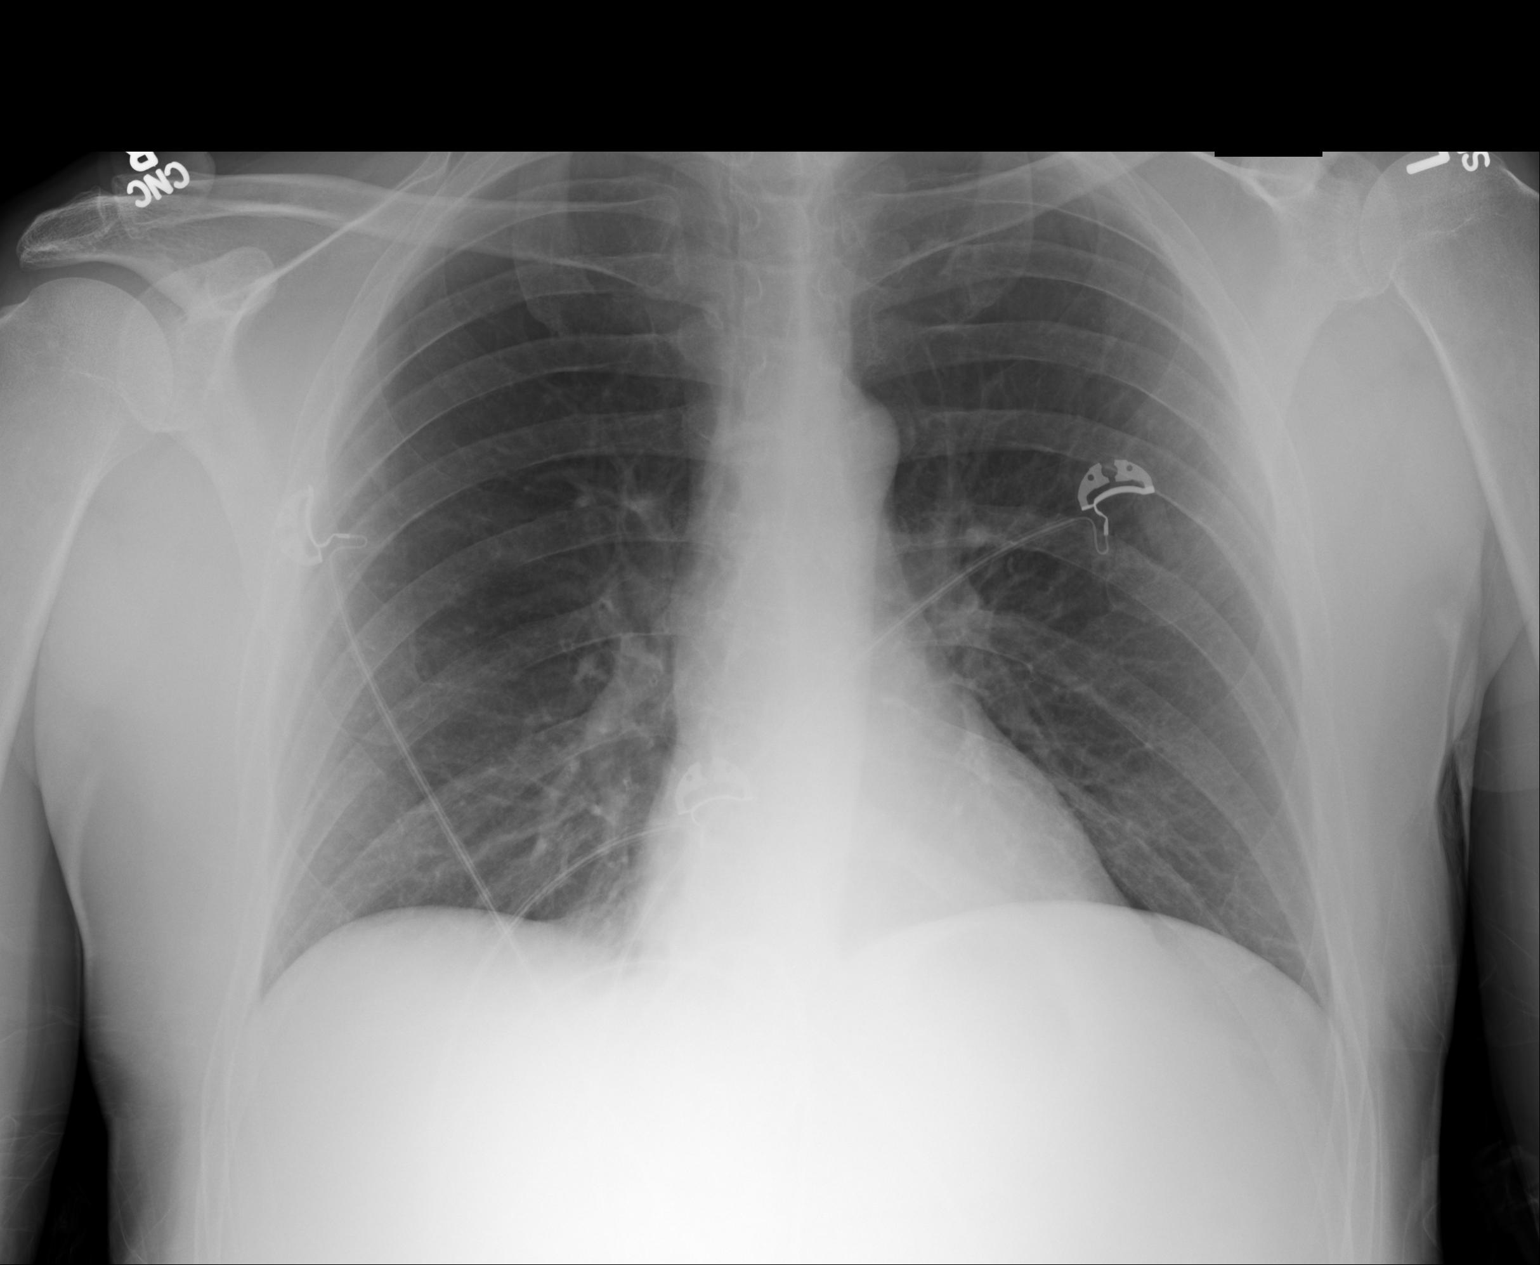

[1 of 1 positions shown; findings below may reference images not displayed]

FINDINGS: Lung volumes are low. No consolidative airspace disease. No pleural
effusions. No pneumothorax. No pulmonary nodule or mass noted.
Pulmonary vasculature and the cardiomediastinal silhouette are
within normal limits.
IMPRESSION: 1. Low lung volumes without radiographic evidence of acute
cardiopulmonary disease.

## 2020-10-18 ENCOUNTER — Emergency Department: Payer: BC Managed Care – PPO

## 2020-10-18 ENCOUNTER — Encounter: Payer: Self-pay | Admitting: Radiology

## 2020-10-18 ENCOUNTER — Other Ambulatory Visit: Payer: Self-pay

## 2020-10-18 ENCOUNTER — Emergency Department
Admission: EM | Admit: 2020-10-18 | Discharge: 2020-10-18 | Disposition: A | Discharge status: Home / Self Care | Payer: BC Managed Care – PPO | Attending: Emergency Medicine | Admitting: Emergency Medicine

## 2020-10-18 DIAGNOSIS — I10 Essential (primary) hypertension: Secondary | ICD-10-CM | POA: Insufficient documentation

## 2020-10-18 DIAGNOSIS — Z20822 Contact with and (suspected) exposure to covid-19: Secondary | ICD-10-CM | POA: Diagnosis not present

## 2020-10-18 DIAGNOSIS — R509 Fever, unspecified: Secondary | ICD-10-CM | POA: Diagnosis present

## 2020-10-18 DIAGNOSIS — E039 Hypothyroidism, unspecified: Secondary | ICD-10-CM | POA: Insufficient documentation

## 2020-10-18 DIAGNOSIS — K529 Noninfective gastroenteritis and colitis, unspecified: Secondary | ICD-10-CM | POA: Diagnosis not present

## 2020-10-18 DIAGNOSIS — R1011 Right upper quadrant pain: Secondary | ICD-10-CM

## 2020-10-18 DIAGNOSIS — F17220 Nicotine dependence, chewing tobacco, uncomplicated: Secondary | ICD-10-CM | POA: Diagnosis not present

## 2020-10-18 DIAGNOSIS — F17210 Nicotine dependence, cigarettes, uncomplicated: Secondary | ICD-10-CM | POA: Insufficient documentation

## 2020-10-18 DIAGNOSIS — Z79899 Other long term (current) drug therapy: Secondary | ICD-10-CM | POA: Insufficient documentation

## 2020-10-18 LAB — COMPREHENSIVE METABOLIC PANEL
ALT: 22 U/L (ref 0–44)
AST: 23 U/L (ref 15–41)
Albumin: 4.3 g/dL (ref 3.5–5.0)
Alkaline Phosphatase: 85 U/L (ref 38–126)
Anion gap: 7 (ref 5–15)
BUN: 10 mg/dL (ref 6–20)
CO2: 26 mmol/L (ref 22–32)
Calcium: 9.4 mg/dL (ref 8.9–10.3)
Chloride: 101 mmol/L (ref 98–111)
Creatinine, Ser: 1.1 mg/dL (ref 0.61–1.24)
GFR, Estimated: >60 mL/min (ref >60)
Glucose, Bld: 137 mg/dL — ABNORMAL HIGH (ref 70–99)
Potassium: 4.3 mmol/L (ref 3.5–5.1)
Sodium: 134 mmol/L — ABNORMAL LOW (ref 135–145)
Total Bilirubin: 1.2 mg/dL (ref 0.3–1.2)
Total Protein: 7.8 g/dL (ref 6.5–8.1)

## 2020-10-18 LAB — URINALYSIS, ROUTINE W REFLEX MICROSCOPIC
Bilirubin Urine: NEGATIVE
Glucose, UA: NEGATIVE mg/dL
Hgb urine dipstick: NEGATIVE
Ketones, ur: 5 mg/dL — AB
Leukocytes,Ua: NEGATIVE
Nitrite: NEGATIVE
Protein, ur: 30 mg/dL — AB
Specific Gravity, Urine: 1.027 (ref 1.005–1.030)
pH: 5 (ref 5.0–8.0)

## 2020-10-18 LAB — CBC WITH DIFFERENTIAL/PLATELET
Abs Immature Granulocytes: 0.04 10*3/uL (ref 0.00–0.07)
Basophils Absolute: 0 10*3/uL (ref 0.0–0.1)
Basophils Relative: 0 %
Eosinophils Absolute: 0 10*3/uL (ref 0.0–0.5)
Eosinophils Relative: 0 %
HCT: 43.2 % (ref 39.0–52.0)
Hemoglobin: 14.9 g/dL (ref 13.0–17.0)
Immature Granulocytes: 0 %
Lymphocytes Relative: 6 %
Lymphs Abs: 0.7 10*3/uL (ref 0.7–4.0)
MCH: 31 pg (ref 26.0–34.0)
MCHC: 34.5 g/dL (ref 30.0–36.0)
MCV: 89.8 fL (ref 80.0–100.0)
Monocytes Absolute: 0.6 10*3/uL (ref 0.1–1.0)
Monocytes Relative: 5 %
Neutro Abs: 11 10*3/uL — ABNORMAL HIGH (ref 1.7–7.7)
Neutrophils Relative %: 89 %
Platelets: 322 10*3/uL (ref 150–400)
RBC: 4.81 MIL/uL (ref 4.22–5.81)
RDW: 12.8 % (ref 11.5–15.5)
WBC: 12.4 10*3/uL — ABNORMAL HIGH (ref 4.0–10.5)
nRBC: 0 % (ref 0.0–0.2)

## 2020-10-18 LAB — LIPASE, BLOOD: Lipase: 28 U/L (ref 11–51)

## 2020-10-18 LAB — RESP PANEL BY RT-PCR (FLU A&B, COVID) ARPGX2
Influenza A by PCR: NEGATIVE
Influenza B by PCR: NEGATIVE
SARS Coronavirus 2 by RT PCR: NEGATIVE

## 2020-10-18 LAB — TROPONIN I (HIGH SENSITIVITY)
Troponin I (High Sensitivity): 6 ng/L (ref <18)
Troponin I (High Sensitivity): 4 ng/L (ref <18)

## 2020-10-18 MED ORDER — IOHEXOL 9 MG/ML PO SOLN
1000.0000 mL | Freq: Once | ORAL | Status: AC | PRN
  Administered 2020-10-18: 1000 mL via ORAL

## 2020-10-18 MED ORDER — SODIUM CHLORIDE 0.9 % IV BOLUS
1000.0000 mL | Freq: Once | INTRAVENOUS | Status: AC
  Administered 2020-10-18: 1000 mL via INTRAVENOUS

## 2020-10-18 MED ORDER — METOCLOPRAMIDE HCL 10 MG PO TABS: 10.0000 mg | ORAL_TABLET | Freq: Four times a day (QID) | ORAL | 0 refills | Status: DC | PRN

## 2020-10-18 MED ORDER — SUCRALFATE 1 G PO TABS
1.0000 g | ORAL_TABLET | Freq: Four times a day (QID) | ORAL | 0 refills | Status: DC | PRN
Start: 2020-10-18 — End: 2020-10-18

## 2020-10-18 MED ORDER — ONDANSETRON HCL 4 MG/2ML IJ SOLN
4.0000 mg | Freq: Once | INTRAMUSCULAR | Status: AC
  Administered 2020-10-18: 4 mg via INTRAVENOUS
  Filled 2020-10-18: qty 2

## 2020-10-18 MED ORDER — SUCRALFATE 1 G PO TABS: 1.0000 g | ORAL_TABLET | Freq: Four times a day (QID) | ORAL | 0 refills | Status: AC | PRN

## 2020-10-18 MED ORDER — IOHEXOL 350 MG/ML SOLN
100.0000 mL | Freq: Once | INTRAVENOUS | Status: AC | PRN
  Administered 2020-10-18: 100 mL via INTRAVENOUS

## 2020-10-18 MED ORDER — METOCLOPRAMIDE HCL 5 MG/ML IJ SOLN
10.0000 mg | Freq: Once | INTRAMUSCULAR | Status: AC
  Administered 2020-10-18: 10 mg via INTRAVENOUS
  Filled 2020-10-18: qty 2

## 2020-10-18 MED ORDER — METOCLOPRAMIDE HCL 10 MG PO TABS: 10.0000 mg | ORAL_TABLET | Freq: Four times a day (QID) | ORAL | 0 refills | Status: AC | PRN

## 2020-10-18 NOTE — ED Triage Notes (Signed)
Pt here with fever, chills, and diarrhea x1 day. Pt states that his highest temp at home was 99.3 but he does not feel well. Pt denies any new medications or foods. Pt states pain in the center of his abd but does not radiate.

## 2020-10-18 NOTE — ED Provider Notes (Signed)
[facility] Emergency Department Provider Note ____________________________________________   Event Date/Time   First MD Initiated Contact with Patient 10/18/20 1048     (approximate)  I have reviewed the triage vital signs and the nursing notes.   HISTORY  Chief Complaint Fever and Chills  HPI Brian Lang is a 55 y.o. male with history of diverticulitis, hypothyroidism, hyperlipidemia, and remaining history as listed below presents to the emergency department for treatment and evaluation of fever, chills, diarrhea for the past 24 hours.  He is also felt feverish but no documented temp above 99.3.  He has general malaise.  Also complaining of some vague upper abdominal pain without radiation..         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   Diverticulitis of colon with perforation 11/11/2013    Past Surgical History:  Procedure Laterality Date   COLON SURGERY     ear surgery Left    ESOPHAGOGASTRODUODENOSCOPY N/A 11/18/2014   Procedure: ESOPHAGOGASTRODUODENOSCOPY (EGD);  Surgeon: Wallace CullensPaul Y Oh, MD;  [facility] ENDOSCOPY;  Service: Endoscopy;  Laterality: N/A;   KNEE ARTHROSCOPY Left    VENTRAL HERNIA REPAIR N/A 08/26/2015   Procedure: LAPAROSCOPIC VENTRAL HERNIA and insertion of mesh;  Surgeon: Leafy Roiego F Pabon, MD;  Location: [facility] ORS;  Service: General;  Laterality: N/A;    Prior to Admission medications   Medication Sig Start Date End Date  Taking? Authorizing Provider  cetirizine (ZYRTEC) 10 MG tablet Take 10 mg by mouth daily.    [provider]  gabapentin (NEURONTIN) 400 MG capsule Take 1 capsule (400 mg total) by mouth 3 (three) times daily. 08/27/15   Pabon, Diego F, MD  HYDROcodone-acetaminophen (NORCO/VICODIN) 5-325 MG tablet Take 1 tablet by mouth every 6 (six) hours as needed for moderate pain. 09/08/15   Loflin, Laney Potashatherine L, MD  levothyroxine (SYNTHROID, LEVOTHROID) 88 MCG tablet Take 88 mcg by mouth daily before breakfast.    [provider]  methocarbamol (ROBAXIN) 500 MG tablet Take 1 tablet (500 mg total) by mouth every 6 (six) hours as needed for muscle spasms. 08/27/15   Pabon, Diego F, MD  metoCLOPramide (REGLAN) 10 MG tablet Take 1 tablet (10 mg total) by mouth every 6 (six) hours as needed for nausea. 10/18/20 11/17/20  Sueko Dimichele, Rulon Eisenmengerari B, FNP  montelukast (SINGULAIR) 10 MG tablet Take 1 tablet by mouth daily. 08/18/15   [provider]  nicotine (NICODERM CQ - DOSED IN MG/24 HOURS) 14 mg/24hr patch Place 1 patch (14 mg total) onto the skin daily. 08/27/15   Pabon, Diego F, MD  nicotine (NICODERM CQ - DOSED IN MG/24 HOURS) 21 mg/24hr patch Place 1 patch (21 mg total) onto the skin daily. Use 21 mg patch for 7 days, use 14 mg patch for 7 days, use 7mg  patch for 7 days. 07/06/15   Midge MiniumWohl, Darren, MD  nicotine (NICODERM CQ - DOSED IN MG/24 HR) 7 mg/24hr patch Place 1 patch (7 mg total) onto  the skin daily. 08/27/15   Pabon, Diego F, MD  sucralfate (CARAFATE) 1 g tablet Take 1 tablet (1 g total) by mouth 4 (four) times daily as needed for up to 14 days. 10/18/20 11/01/20  Kem Boroughs B, FNP    Allergies Cyclobenzaprine and Augmentin [amoxicillin-pot clavulanate]  Family History  Problem Relation Age of Onset   Diabetes Brother    Esophageal cancer Maternal Uncle     Social History Social History   Tobacco Use   Smoking status: Light Smoker    Packs/day: 0.25    Types: Cigarettes   Smokeless  tobacco: Current    Types: Chew   Tobacco comments:    1 can/day  Substance Use Topics   Alcohol use: Yes    Alcohol/week: 2.0 standard drinks    Types: 2 Cans of beer per week   Drug use: No    Review of Systems  Constitutional: No fever/chills Eyes: No visual changes. ENT: No sore throat. Cardiovascular: Denies chest pain. Respiratory: Denies shortness of breath. Gastrointestinal: Positive for abdominal pain.  No nausea, no vomiting.  No diarrhea.  No constipation. Genitourinary: Negative for dysuria. Musculoskeletal: Negative for back pain. Skin: Negative for rash. Neurological: Negative for headaches, focal weakness or numbness.  ____________________________________________   PHYSICAL EXAM:  VITAL SIGNS: ED Triage Vitals  Enc Vitals Group     BP 10/18/20 0954 137/86     Pulse Rate 10/18/20 0954 85     Resp 10/18/20 0954 18     Temp 10/18/20 0954 98.5 F (36.9 C)     Temp Source 10/18/20 0954 Oral     SpO2 10/18/20 0954 97 %     Weight 10/18/20 0955 153 lb (69.4 kg)     Height 10/18/20 0955 5\' 7"  (1.702 m)     Head Circumference --      Peak Flow --      Pain Score 10/18/20 0955 6     Pain Loc --      Pain Edu? --      Excl. in GC? --     Constitutional: Alert and oriented. Well appearing and in no acute distress. Eyes: Conjunctivae are normal. PERRL. EOMI. Head: Atraumatic. Nose: No congestion/rhinnorhea. Mouth/Throat: Mucous membranes are moist.  Oropharynx non-erythematous. Neck: No stridor.   Hematological/Lymphatic/Immunilogical: No cervical lymphadenopathy. Cardiovascular: Normal rate, regular rhythm. Grossly normal heart sounds.  Good peripheral circulation. Respiratory: Normal respiratory effort.  No retractions. Lungs CTAB. Gastrointestinal: Soft. Tenderness over epigastric area and right upper quadrant. No distention. No abdominal bruits.  Musculoskeletal: No lower extremity tenderness nor edema.  No joint effusions. Neurologic:  Normal speech  and language. No gross focal neurologic deficits are appreciated. No gait instability. Skin:  Skin is warm, dry and intact. No rash noted. Psychiatric: Mood and affect are normal. Speech and behavior are normal.  ____________________________________________   LABS (all labs ordered are listed, but only abnormal results are displayed)  Labs Reviewed  COMPREHENSIVE METABOLIC PANEL - Abnormal; Notable for the following components:      Result Value   Sodium 134 (*)    Glucose, Bld 137 (*)    All other components within normal limits  CBC WITH DIFFERENTIAL/PLATELET - Abnormal; Notable for the following components:   WBC 12.4 (*)    Neutro Abs 11.0 (*)    All other components within normal limits  URINALYSIS, ROUTINE W REFLEX MICROSCOPIC - Abnormal; Notable for the following components:   Color, Urine AMBER (*)    APPearance HAZY (*)  Ketones, ur 5 (*)    Protein, ur 30 (*)    Bacteria, UA FEW (*)    All other components within normal limits  RESP PANEL BY RT-PCR (FLU A&B, COVID) ARPGX2  LIPASE, BLOOD  TROPONIN I (HIGH SENSITIVITY)  TROPONIN I (HIGH SENSITIVITY)   ____________________________________________  EKG  Not indicated. ____________________________________________  RADIOLOGY  ED MD interpretation:    Right upper quadrant ultrasound negative for acute findings.  CT of the abdomen and pelvis with IV and oral contrast showing nonspecific colitis but otherwise normal exam.  I, Taiya Nutting, personally viewed and evaluated these images (plain radiographs) as part of my medical decision making, as well as reviewing the written report by the radiologist.  Official radiology report(s): CT ABDOMEN PELVIS W CONTRAST  Result Date: 10/18/2020 CLINICAL DATA:  Epigastric pain. Patient reports history of partial colectomy. EXAM: CT ABDOMEN AND PELVIS WITH CONTRAST TECHNIQUE: Multidetector CT imaging of the abdomen and pelvis was performed using the standard protocol  following bolus administration of intravenous contrast. CONTRAST:  OMNIPAQUE IOHEXOL 350 MG/ML SOLN COMPARISON:  CT of the abdomen and pelvis 07/13/2015. Right upper quadrant ultrasound 10/18/2020 FINDINGS: Lower chest: Asymmetric left lower lobe airspace disease is present. Right lung is clear. Heart size is normal. No significant pleural or pericardial effusion is present. Hepatobiliary: No focal liver abnormality is seen. No gallstones, gallbladder wall thickening, or biliary dilatation. Pancreas: Unremarkable. No pancreatic ductal dilatation or surrounding inflammatory changes. Spleen: Normal in size without focal abnormality. Adrenals/Urinary Tract: Adrenal glands are normal bilaterally. Kidneys are unremarkable. No stone or mass lesion is present. Ureters are within normal limits. The urinary bladder is collapsed, within normal limits. Stomach/Bowel: Stomach and duodenum are within normal limits. Small bowel is unremarkable. Terminal ileum is within normal limits. The appendix is visualized and normal. Diffuse circumferential mucosal thickening is present in the ascending and proximal transverse colon. No discrete mass lesion is present. Diverticular changes are present in the distal sigmoid colon. No inflammatory changes are present. Partial sigmoid resection suspected. No focal inflammation present. Rectum is unremarkable. Vascular/Lymphatic: Atherosclerotic calcifications are present in the distal aorta, just above the bifurcation. Minimal calcification is present at the origin of the SMA and left renal artery. No aneurysm is present. Reproductive: Prostate is unremarkable. Other: No abdominal wall hernia or abnormality. No abdominopelvic ascites. Musculoskeletal: No focal osseous lesions are present. IMPRESSION: 1. Diffuse circumferential mucosal thickening in the ascending and proximal transverse colon compatible with a nonspecific colitis. 2. Sigmoid diverticulosis without diverticulitis. 3. Aortic  Atherosclerosis (ICD10-I70.0). Electronically Signed   By: Marin Roberts M.D.   On: 10/18/2020 15:48   US Abdomen Limited RUQ (LIVER/GB)  Result Date: 10/18/2020 CLINICAL DATA:  RIGHT quadrant pain, nausea, and vomiting for 2 days EXAM: ULTRASOUND ABDOMEN LIMITED RIGHT UPPER QUADRANT COMPARISON:  CT abdomen and pelvis 07/13/2015 FINDINGS: Gallbladder: Normally distended without stones or wall thickening. No pericholecystic fluid or sonographic Murphy sign. Common bile duct: Diameter: 3 mm, normal Liver: Normal echogenicity without mass or nodularity. Portal vein is patent on color Doppler imaging with normal direction of blood flow towards the liver. Other: No RIGHT upper quadrant free fluid. IMPRESSION: Normal exam. Electronically Signed   By: Ulyses Southward M.D.   On: 10/18/2020 12:42    ____________________________________________   PROCEDURES  Procedure(s) performed (including Critical Care):  Procedures  ____________________________________________   INITIAL IMPRESSION / ASSESSMENT AND PLAN     55 year old male presenting to the emergency department for treatment and evaluation of right upper  quadrant and epigastric abdominal pain.  See HPI for further details.  Plan will be to get an ultrasound of the right upper quadrant.  DIFFERENTIAL DIAGNOSIS  Cholelithiasis, acute cholecystitis, gastritis, recurrence of colon cancer  ED COURSE   Clinical Course as of 10/18/20 1623  Mon Oct 18, 2020  1304 Labs are overall reassuring.  There is a mild leukocytosis at 12.4 with a neutrophil count of 11.  Urinalysis and electrolytes are all normal.  Right upper quadrant ultrasound is without concern for acute cholecystitis.  Because of patient's significant past medical history and multiple abdominal surgeries, will proceed with CT of the abdomen and pelvis with contrast. [CT]  1308 Patient updated on results of labs and [place] as well as plan for CT. He is now complaining of sharp epigastric  pain that comes and goes. Reglan ordered.  [CT]  1331 CT ABDOMEN PELVIS W CONTRAST [CT]  1622 CT results discussed with the patient and family member.  Plan will be to discharge him home with Reglan and Carafate.  He is to call and schedule follow-up appointment with gastroenterology.  Patient states that he does not currently have a gastroenterologist and will be provided information for on-call service.  Patient was advised to return to emergency department for symptoms of change or worsen if he is unable to schedule an appointment. [CT]    Clinical Course User Index [CT] Mithran Strike B, FNP   ___________________________________________   FINAL CLINICAL IMPRESSION(S) / ED DIAGNOSES  Final diagnoses:  RUQ pain  Colitis, acute     ED Discharge Orders          Ordered    metoCLOPramide (REGLAN) 10 MG tablet  Every 6 hours PRN,   Status:  Discontinued        10/18/20 1611    sucralfate (CARAFATE) 1 g tablet  4 times daily PRN,   Status:  Discontinued        10/18/20 1611    metoCLOPramide (REGLAN) 10 MG tablet  Every 6 hours PRN        10/18/20 1620    sucralfate (CARAFATE) 1 g tablet  4 times daily PRN        10/18/20 1620             Nikolus D [place] was evaluated in Emergency Department on 10/18/2020 for the symptoms described in the history of present illness. He was evaluated in the context of the global COVID-19 pandemic, which necessitated consideration that the patient might be at risk for infection with the SARS-CoV-2 virus that causes COVID-19. Institutional protocols and algorithms that pertain to the evaluation of patients at risk for COVID-19 are in a state of rapid change based on information released by regulatory bodies including the CDC and federal and state organizations. These policies and algorithms were followed during the patient's care in the ED.   Note:  This document was prepared using Dragon voice recognition software and may include unintentional  dictation errors.    Chinita Pester, FNP 10/18/20 1623    Gilles Chiquito, MD 10/18/20 1630

## 2020-10-18 NOTE — ED Notes (Signed)
Patient is resting comfortably. 

## 2020-10-18 NOTE — ED Notes (Signed)
Radiology notified that patient has completed drinking oral contrast.

## 2020-10-18 NOTE — Discharge Instructions (Addendum)
Please follow up with your gastroenterologist.  Take the medication as prescribed.  Return to the ER for symptoms that change, worsen, or for new concerns if unable to schedule an appointment.

## 2022-08-06 IMAGING — US US ABDOMEN LIMITED
1 series · 14 of 25 positions shown · non-contrast
Comparison: CT abdomen and pelvis 07/13/2015

CLINICAL DATA: RIGHT quadrant pain, nausea, and vomiting for 2 days

EXAM:
ULTRASOUND ABDOMEN LIMITED RIGHT UPPER QUADRANT

[Series 1: us abdomen limited ruq (liver/gb) · 14 of 47 slices shown]
[im 1/47]
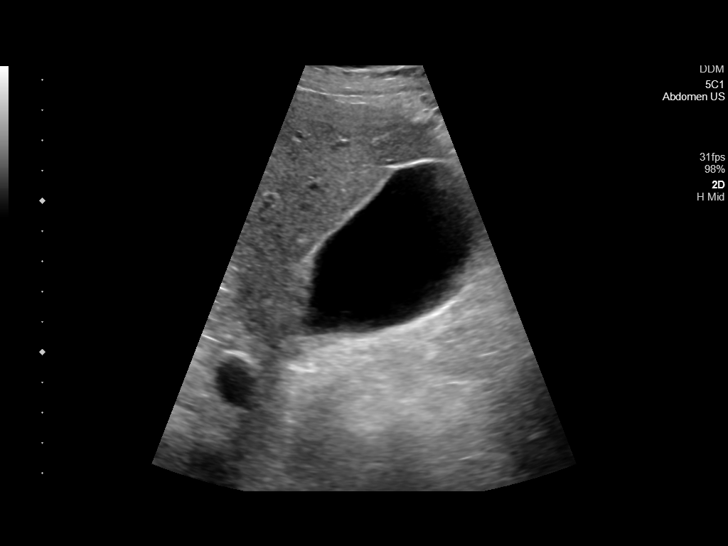
[im 4/47]
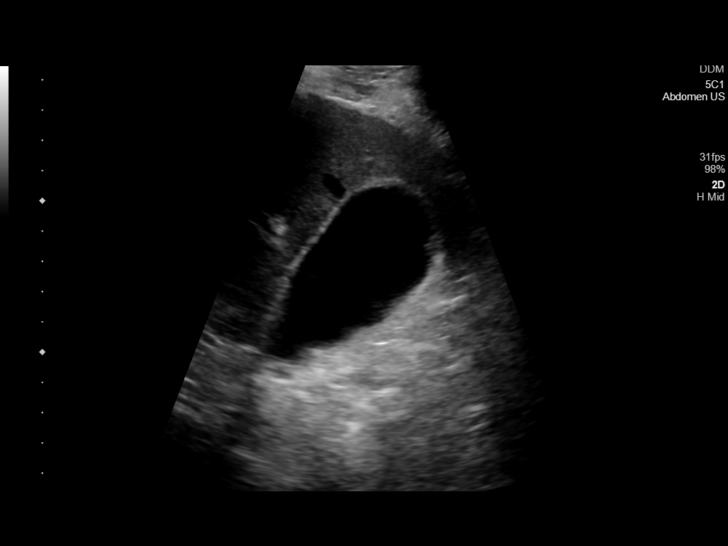
[im 8/47]
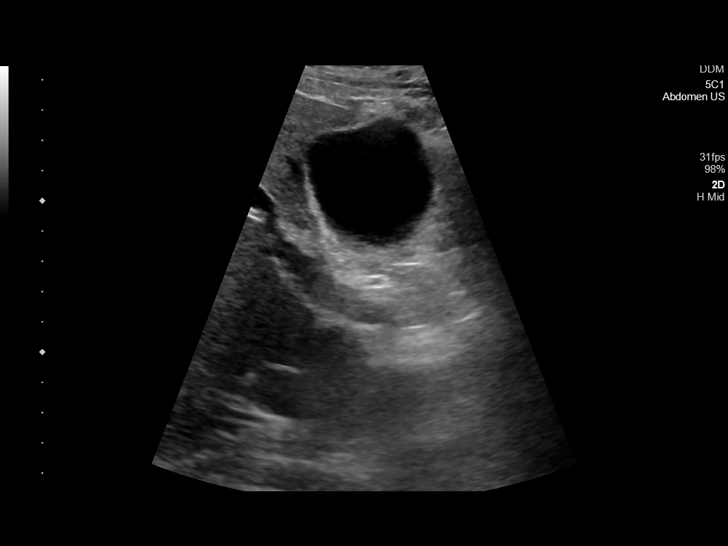
[im 12/47]
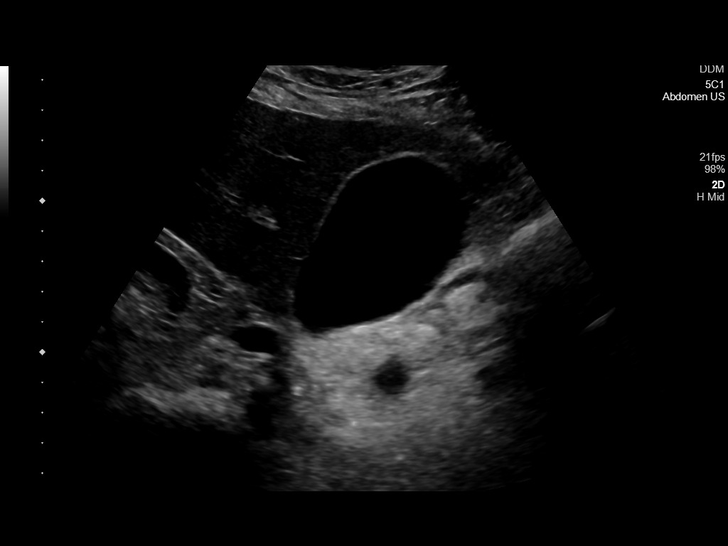
[im 16/47]
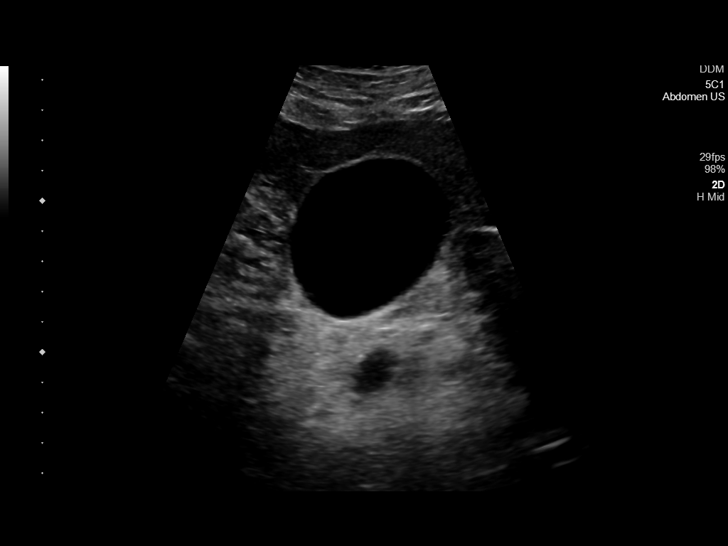
[im 18/47]
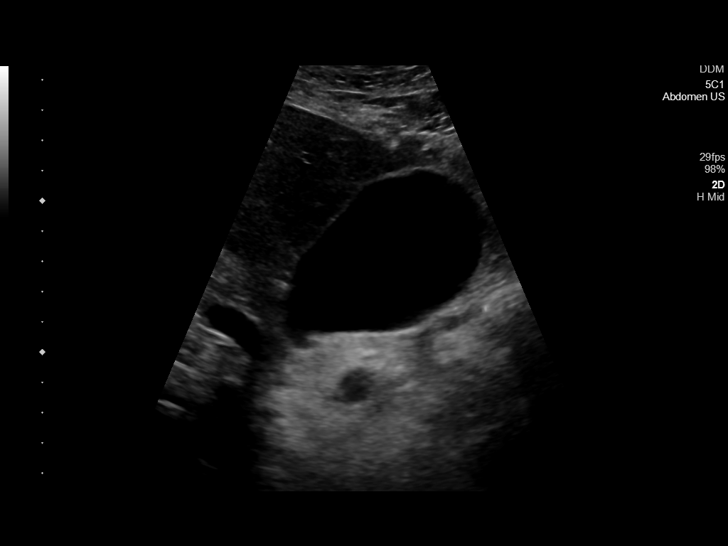
[im 22/47]
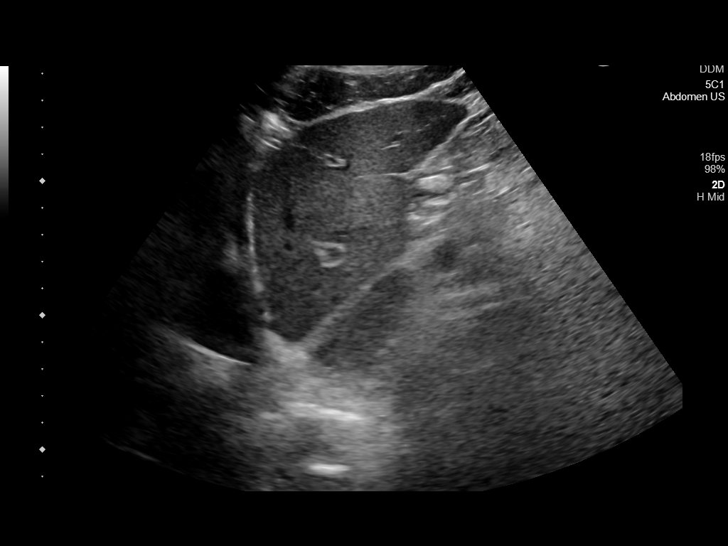
[im 25/47]
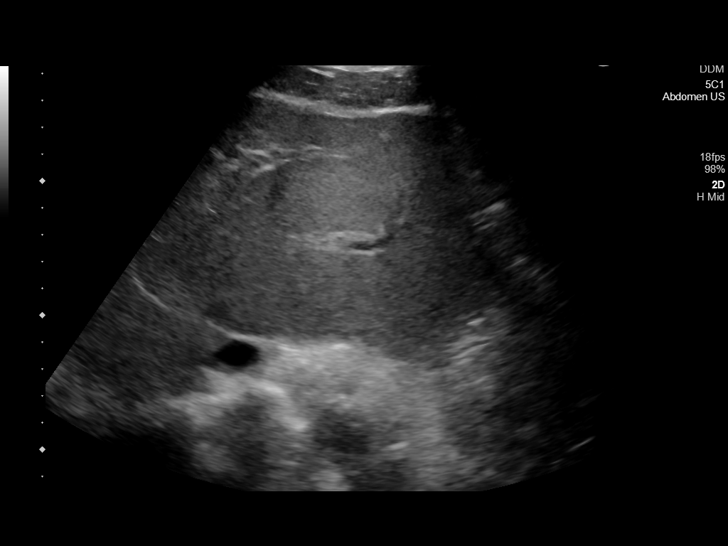
[im 29/47]
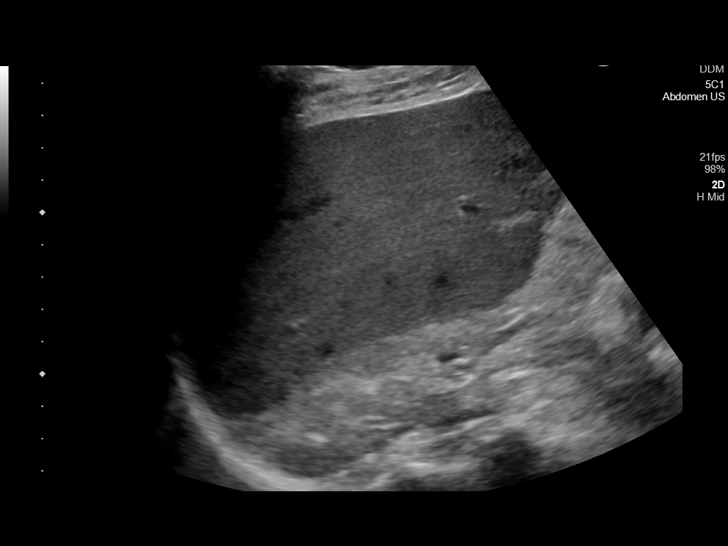
[im 31/47]
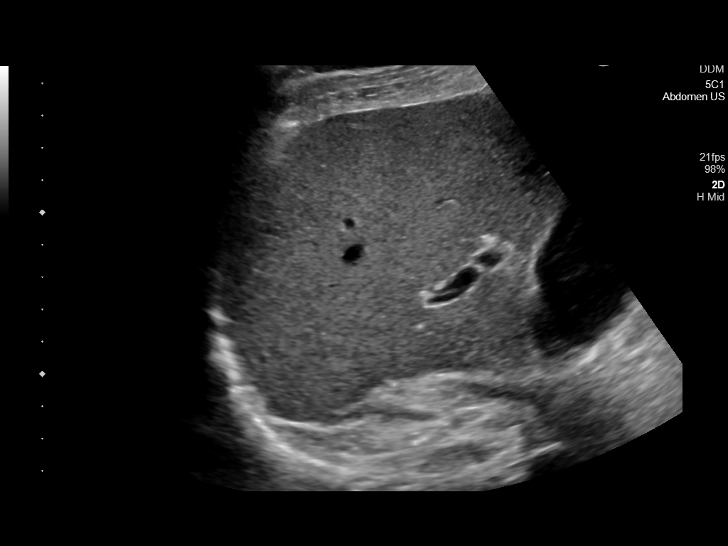
[im 35/47]
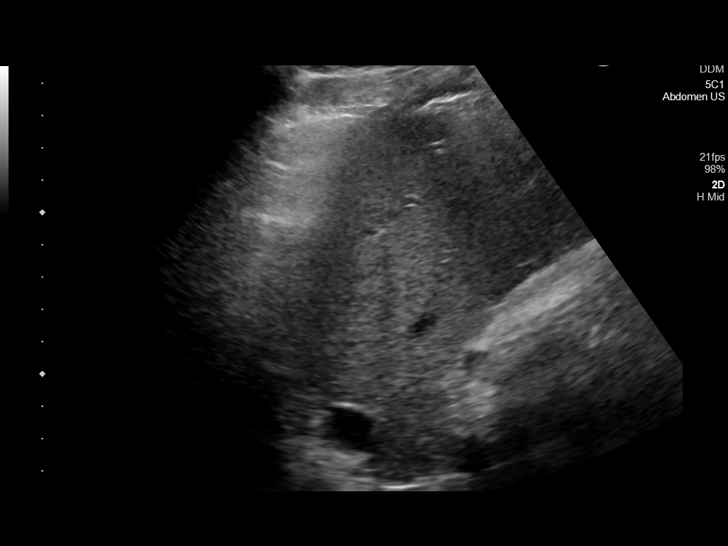
[im 39/47]
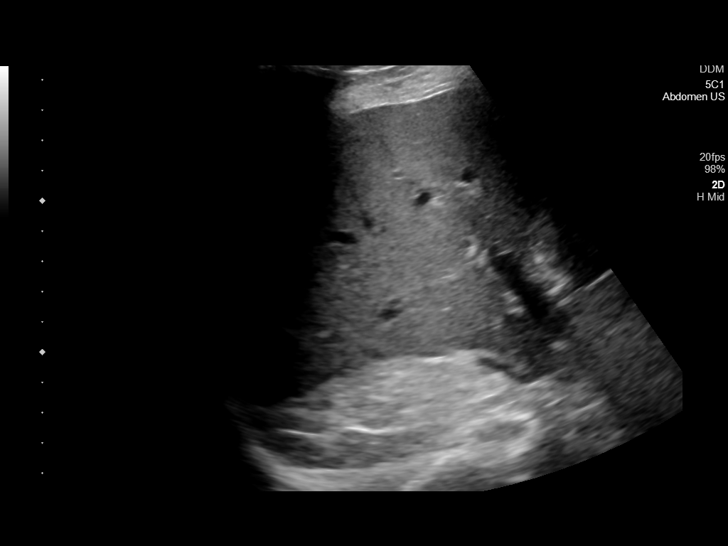
[im 43/47]
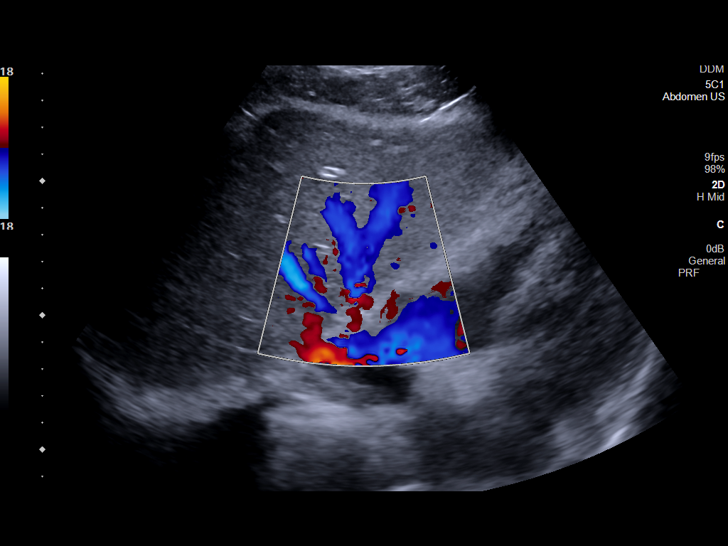
[im 47/47]
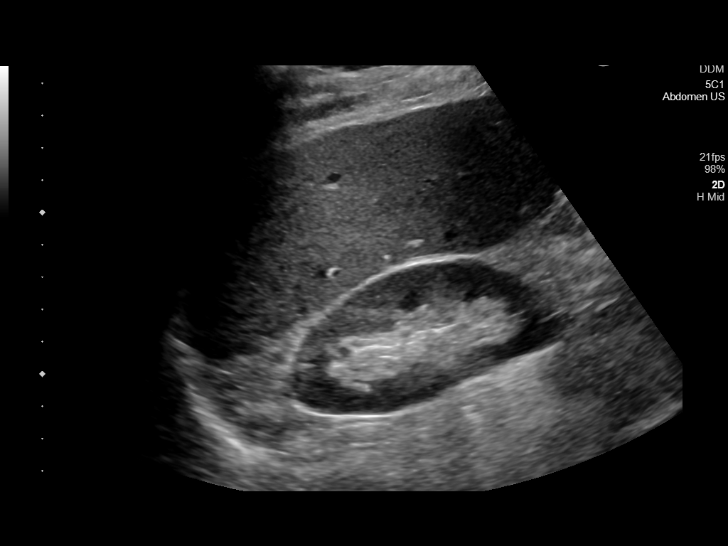

[14 of 25 positions shown; findings below may reference images not displayed]

FINDINGS: Gallbladder:

Normally distended without stones or wall thickening. No
pericholecystic fluid or sonographic Murphy sign.

Common bile duct:

Diameter: 3 mm, normal

Liver:

Normal echogenicity without mass or nodularity. Portal vein is
patent on color Doppler imaging with normal direction of blood flow
towards the liver.

Other: No RIGHT upper quadrant free fluid.
IMPRESSION: Normal exam.

## 2023-01-02 ENCOUNTER — Other Ambulatory Visit: Payer: Self-pay | Admitting: *Deleted

## 2023-01-02 DIAGNOSIS — Z1211 Encounter for screening for malignant neoplasm of colon: Secondary | ICD-10-CM

## 2023-01-02 NOTE — Progress Notes (Signed)
See colorectal screening flowsheet. Explained the FIT Test and answered questions. Gave FIT Test to patient to complete at home.

## 2023-02-13 ENCOUNTER — Other Ambulatory Visit: Payer: Self-pay | Admitting: Physical Medicine and Rehabilitation

## 2023-02-13 ENCOUNTER — Ambulatory Visit
Admission: RE | Admit: 2023-02-13 | Discharge: 2023-02-13 | Disposition: A | Discharge status: Home / Self Care | Payer: BC Managed Care – PPO | Source: Ambulatory Visit | Attending: Physical Medicine and Rehabilitation | Admitting: Physical Medicine and Rehabilitation

## 2023-02-13 DIAGNOSIS — M5126 Other intervertebral disc displacement, lumbar region: Secondary | ICD-10-CM

## 2023-02-13 DIAGNOSIS — M5416 Radiculopathy, lumbar region: Secondary | ICD-10-CM

## 2023-04-27 ENCOUNTER — Other Ambulatory Visit: Payer: Self-pay | Admitting: Orthopedic Surgery

## 2023-04-30 ENCOUNTER — Encounter: Payer: Self-pay | Admitting: Orthopedic Surgery

## 2023-05-01 NOTE — Anesthesia Preprocedure Evaluation (Addendum)
 Anesthesia Evaluation  Patient identified by MRN, date of birth, ID band Patient awake    Reviewed: Allergy & Precautions, H&P , NPO status , Patient's Chart, lab work & pertinent test results  Airway Mallampati: I  TM Distance: <3 FB Neck ROM: Full    Dental no notable dental hx.    Pulmonary Current Smoker   Pulmonary exam normal breath sounds clear to auscultation       Cardiovascular hypertension, negative cardio ROS Normal cardiovascular exam Rhythm:Regular Rate:Normal     Neuro/Psych  Headaches negative neurological ROS  negative psych ROS   GI/Hepatic negative GI ROS, Neg liver ROS,,,  Endo/Other  negative endocrine ROSHypothyroidism    Renal/GU negative Renal ROS  negative genitourinary   Musculoskeletal negative musculoskeletal ROS (+) Arthritis ,    Abdominal   Peds negative pediatric ROS (+)  Hematology negative hematology ROS (+)   Anesthesia Other Findings Arthritis  Thyroid disease Diverticulitis  Hypothyroidism Indigestion  Rare, mild GERD Used to have sleep apnea, but lost weight and no longer has sleep apnea  Reproductive/Obstetrics negative OB ROS                              Anesthesia Physical Anesthesia Plan  ASA: 2  Anesthesia Plan:    Post-op Pain Management:    Induction: Intravenous  PONV Risk Score and Plan:   Airway Management Planned: LMA  Additional Equipment:   Intra-op Plan:   Post-operative Plan: Extubation in OR  Informed Consent: I have reviewed the patients History and Physical, chart, labs and discussed the procedure including the risks, benefits and alternatives for the proposed anesthesia with the patient or authorized representative who has indicated his/her understanding and acceptance.     Dental Advisory Given  Plan Discussed with: Anesthesiologist, CRNA and Surgeon  Anesthesia Plan Comments: (Patient consented for risks  of anesthesia including but not limited to:  - adverse reactions to medications - damage to eyes, teeth, lips or other oral mucosa - nerve damage due to positioning  - sore throat or hoarseness - Damage to heart, brain, nerves, lungs, other parts of body or loss of life  Patient voiced understanding and assent.)         Anesthesia Quick Evaluation

## 2023-05-08 ENCOUNTER — Encounter
Admission: RE | Disposition: A | Discharge status: Home / Self Care | Payer: Self-pay | Source: Home / Self Care | Attending: Orthopedic Surgery

## 2023-05-08 ENCOUNTER — Encounter: Payer: Self-pay | Admitting: Orthopedic Surgery

## 2023-05-08 ENCOUNTER — Other Ambulatory Visit: Payer: Self-pay

## 2023-05-08 ENCOUNTER — Ambulatory Visit: Payer: 59 | Admitting: Anesthesiology

## 2023-05-08 ENCOUNTER — Ambulatory Visit
Admission: RE | Admit: 2023-05-08 | Discharge: 2023-05-08 | Disposition: A | Discharge status: Home / Self Care | Payer: 59 | Attending: Orthopedic Surgery | Admitting: Orthopedic Surgery

## 2023-05-08 DIAGNOSIS — M1990 Unspecified osteoarthritis, unspecified site: Secondary | ICD-10-CM | POA: Insufficient documentation

## 2023-05-08 DIAGNOSIS — F17210 Nicotine dependence, cigarettes, uncomplicated: Secondary | ICD-10-CM | POA: Insufficient documentation

## 2023-05-08 DIAGNOSIS — E039 Hypothyroidism, unspecified: Secondary | ICD-10-CM | POA: Diagnosis not present

## 2023-05-08 DIAGNOSIS — Z79890 Hormone replacement therapy (postmenopausal): Secondary | ICD-10-CM | POA: Insufficient documentation

## 2023-05-08 DIAGNOSIS — M7711 Lateral epicondylitis, right elbow: Secondary | ICD-10-CM | POA: Diagnosis present

## 2023-05-08 HISTORY — PX: TENNIS ELBOW RELEASE/NIRSCHEL PROCEDURE: SHX6651

## 2023-05-08 HISTORY — DX: Gastro-esophageal reflux disease without esophagitis: K21.9

## 2023-05-08 SURGERY — TENNIS ELBOW RELEASE/NIRSCHEL PROCEDURE
Anesthesia: Choice | Site: Elbow | Laterality: Right

## 2023-05-08 MED ORDER — ONDANSETRON HCL 4 MG/2ML IJ SOLN
INTRAMUSCULAR | Status: AC
Start: 2023-05-08 — End: ?
  Filled 2023-05-08: qty 2

## 2023-05-08 MED ORDER — MIDAZOLAM HCL 2 MG/2ML IJ SOLN
2.0000 mg | Freq: Once | INTRAMUSCULAR | Status: AC
  Administered 2023-05-08: 2 mg via INTRAVENOUS

## 2023-05-08 MED ORDER — DEXAMETHASONE SODIUM PHOSPHATE 4 MG/ML IJ SOLN
INTRAMUSCULAR | Status: AC
  Filled 2023-05-08: qty 1

## 2023-05-08 MED ORDER — FENTANYL CITRATE (PF) 100 MCG/2ML IJ SOLN
INTRAMUSCULAR | Status: AC
  Filled 2023-05-08: qty 2

## 2023-05-08 MED ORDER — DEXAMETHASONE SODIUM PHOSPHATE 10 MG/ML IJ SOLN
INTRAMUSCULAR | Status: AC
  Filled 2023-05-08: qty 1

## 2023-05-08 MED ORDER — LIDOCAINE-EPINEPHRINE 1 %-1:100000 IJ SOLN
INTRAMUSCULAR | Status: AC
  Filled 2023-05-08: qty 1

## 2023-05-08 MED ORDER — EPHEDRINE SULFATE (PRESSORS) 50 MG/ML IJ SOLN
INTRAMUSCULAR | Status: DC | PRN
  Administered 2023-05-08: 7.5 mg via INTRAVENOUS
  Administered 2023-05-08: 10 mg via INTRAVENOUS
  Administered 2023-05-08: 7.5 mg via INTRAVENOUS

## 2023-05-08 MED ORDER — CEFAZOLIN SODIUM-DEXTROSE 2-3 GM-%(50ML) IV SOLR
INTRAVENOUS | Status: AC
Start: 2023-05-08 — End: ?
  Filled 2023-05-08: qty 50

## 2023-05-08 MED ORDER — LIDOCAINE-EPINEPHRINE 1 %-1:100000 IJ SOLN
INTRAMUSCULAR | Status: DC | PRN
  Administered 2023-05-08: 15 mL via INTRADERMAL

## 2023-05-08 MED ORDER — EPHEDRINE 5 MG/ML INJ
INTRAVENOUS | Status: AC
  Filled 2023-05-08: qty 5

## 2023-05-08 MED ORDER — PROPOFOL 10 MG/ML IV BOLUS
INTRAVENOUS | Status: AC
  Filled 2023-05-08: qty 20

## 2023-05-08 MED ORDER — 0.9 % SODIUM CHLORIDE (POUR BTL) OPTIME
TOPICAL | Status: DC | PRN
  Administered 2023-05-08: 100 mL

## 2023-05-08 MED ORDER — MIDAZOLAM HCL 5 MG/5ML IJ SOLN
INTRAMUSCULAR | Status: DC | PRN
  Administered 2023-05-08: 2 mg via INTRAVENOUS

## 2023-05-08 MED ORDER — OXYCODONE-ACETAMINOPHEN 5-325 MG PO TABS: 1.0000 | ORAL_TABLET | ORAL | 0 refills | Status: AC | PRN

## 2023-05-08 MED ORDER — SODIUM CHLORIDE 0.9 % IV SOLN: INTRAVENOUS | Status: DC | PRN

## 2023-05-08 MED ORDER — MIDAZOLAM HCL 2 MG/2ML IJ SOLN
INTRAMUSCULAR | Status: AC
Start: 2023-05-08 — End: ?
  Filled 2023-05-08: qty 2

## 2023-05-08 MED ORDER — ROPIVACAINE HCL 5 MG/ML IJ SOLN
INTRAMUSCULAR | Status: DC | PRN
  Administered 2023-05-08: 25 mL via PERINEURAL

## 2023-05-08 MED ORDER — DEXAMETHASONE SODIUM PHOSPHATE 10 MG/ML IJ SOLN
INTRAMUSCULAR | Status: DC | PRN
  Administered 2023-05-08: 10 mg

## 2023-05-08 MED ORDER — FENTANYL CITRATE (PF) 100 MCG/2ML IJ SOLN
INTRAMUSCULAR | Status: DC | PRN
  Administered 2023-05-08: 100 ug via INTRAVENOUS

## 2023-05-08 MED ORDER — MIDAZOLAM HCL 2 MG/2ML IJ SOLN
INTRAMUSCULAR | Status: AC
  Filled 2023-05-08: qty 2

## 2023-05-08 MED ORDER — LIDOCAINE HCL (CARDIAC) PF 100 MG/5ML IV SOSY
PREFILLED_SYRINGE | INTRAVENOUS | Status: DC | PRN
  Administered 2023-05-08: 100 mg via INTRATRACHEAL

## 2023-05-08 MED ORDER — PROPOFOL 10 MG/ML IV BOLUS
INTRAVENOUS | Status: DC | PRN
  Administered 2023-05-08: 160 mg via INTRAVENOUS

## 2023-05-08 MED ORDER — FENTANYL CITRATE (PF) 100 MCG/2ML IJ SOLN
100.0000 ug | Freq: Once | INTRAMUSCULAR | Status: AC
  Administered 2023-05-08: 100 ug via INTRAVENOUS

## 2023-05-08 MED ORDER — LIDOCAINE HCL (PF) 2 % IJ SOLN
INTRAMUSCULAR | Status: AC
  Filled 2023-05-08: qty 5

## 2023-05-08 MED ORDER — ONDANSETRON HCL 4 MG/2ML IJ SOLN
INTRAMUSCULAR | Status: DC | PRN
  Administered 2023-05-08: 4 mg via INTRAVENOUS

## 2023-05-08 MED ORDER — CEFAZOLIN SODIUM-DEXTROSE 2-4 GM/100ML-% IV SOLN
2.0000 g | INTRAVENOUS | Status: AC
Start: 2023-05-08 — End: 2023-05-08
  Administered 2023-05-08: 2 g via INTRAVENOUS

## 2023-05-08 SURGICAL SUPPLY — 17 items
BNDG ELASTIC 4X5.8 VLCR NS LF (GAUZE/BANDAGES/DRESSINGS) ×1 IMPLANT
CHLORAPREP W/TINT 26 (MISCELLANEOUS) ×1 IMPLANT
ELECT REM PT RETURN 9FT ADLT (ELECTROSURGICAL) ×1 IMPLANT
ELECTRODE REM PT RTRN 9FT ADLT (ELECTROSURGICAL) ×1 IMPLANT
GAUZE SPONGE 4X4 12PLY STRL (GAUZE/BANDAGES/DRESSINGS) ×1 IMPLANT
GAUZE XEROFORM 1X8 LF (GAUZE/BANDAGES/DRESSINGS) ×1 IMPLANT
GLOVE SURG SYN 9.0 PF PI (GLOVE) ×1 IMPLANT
GOWN STRL REIN 2XL XLG LVL4 (GOWN DISPOSABLE) ×1 IMPLANT
GOWN STRL REUS W/ TWL LRG LVL3 (GOWN DISPOSABLE) ×1 IMPLANT
KIT TURNOVER KIT A (KITS) ×1 IMPLANT
MANIFOLD NEPTUNE II (INSTRUMENTS) ×1 IMPLANT
NS IRRIG 500ML POUR BTL (IV SOLUTION) ×1 IMPLANT
PACK EXTREMITY ARMC (MISCELLANEOUS) ×1 IMPLANT
PAD CAST 4YDX4 CTTN HI CHSV (CAST SUPPLIES) ×2 IMPLANT
SPLINT CAST 1 STEP 4X30 (MISCELLANEOUS) ×1 IMPLANT
SPONGE T-LAP 18X18 ~~LOC~~+RFID (SPONGE) ×1 IMPLANT
SUT VIC AB 2-0 SH 27XBRD (SUTURE) ×1 IMPLANT

## 2023-05-08 NOTE — H&P (Signed)
 Initial consult 04/25/2023 [facility] Brian Lang - 58 y.o. Male; born [birth year] Encounter Summary , generated on Feb. 17, 2025February 17, 2025  Reason for Visit  Reason for Visit - Reason Comments  Pain     Encounter Details  Encounter Details Date Type Department Care Team (Latest Contact Info) Description  04/25/2023 1:45 PM EST Initial consult [facility]  [address]  [place], [place] [address]  [phone]  Marlena Clipper, MD  [address]  [facility]  [name], [place] [address]  [phone] (Work)  [phone] (Fax)  Lateral epicondylitis of right elbow (Primary Dx)   Social History - documented as of this encounter  Social History Tobacco Use Types Packs/Day Years Used Date  Smoking Tobacco: Some Days          Smokeless Tobacco: Current Chew         Social History Comments: Chewing tobacco, quit 2 yrs before. smoke 5-6 cig a week   Social History Alcohol Use Standard Drinks/Week Comments  Yes 12 (1 standard drink = 0.6 oz pure alcohol) Moderate   Social History [employer] Utilities Answer Date Recorded  In the past 12 months has the electric, gas, oil, or water company threatened to shut off services in your home? No 04/25/2023   Social History Overall Physicist, medical Strain (CARDIA) Answer Date Recorded  How hard is it for you to pay for the very basics like food, housing, medical care, and heating? Somewhat hard 04/25/2023   Social History PHQ-2 Answer Date Recorded  Patient Health Questionnaire-2 Score 0 01/29/2023   Social History Hunger Vital Sign Answer Date Recorded  Within the past 12 months, you worried that your food would run out before you got the money to buy more. Sometimes true 04/25/2023  Within the past 12 months, the food you bought just didn't last and you didn't have money to get more. Never true 04/25/2023   Social History PRAPARE - Transportation Answer Date  Recorded  In the past 12 months, has lack of transportation kept you from medical appointments or from getting medications? No 04/25/2023  In the past 12 months, has lack of transportation kept you from meetings, work, or from getting things needed for daily living? No 04/25/2023   Social History PHQ-9 Answer Date Recorded  (OBSOLETE) PHQ-9 Total Score= 1 06/30/2021   Social History Housing Stability Vital Sign Answer Date Recorded  In the last 12 months, was there a time when you were not able to pay the mortgage or rent on time? No 04/25/2023  In the past 12 months, how many times have you moved where you were living? 0 04/25/2023  At any time in the past 12 months, were you homeless or living in a shelter (including now)? No 04/25/2023   Social History Interpersonal Safety Answer Date Recorded  Is Anyone Hurting/Threatening You or Making You Feel Afraid? No 01/29/2023  Is Anyone Hurting/Threatening You or Making You Feel Afraid? No 01/29/2023   Social History Sex and Gender Information Value Date Recorded  Sex Assigned at Birth Not on file    Legal Sex Male 03/16/2013 3:24 AM EST  Gender Identity Not on file    Sexual Orientation Not on file     Last Filed Vital Signs - documented in this encounter  Last Filed Vital Signs Vital Sign Reading Time Taken Comments  Blood Pressure 117/72 04/25/2023 1:49 PM EST    Pulse - -    Temperature - -  Respiratory Rate - -    Oxygen Saturation - -    Inhaled Oxygen Concentration - -    Weight 70.5 kg (155 lb 6.4 oz) 04/25/2023 1:49 PM EST    Height 170.2 cm (5\' 7" ) 04/25/2023 1:49 PM EST    Body Mass Index 24.34 04/25/2023 1:49 PM EST     Patient Instructions - documented in this encounter  Attachments The following attachments cannot be sent through Care Everywhere.  Elbow: Tennis: Surgery: Pre-op (English) Progress Notes - documented in this encounter  Marlena Clipper, MD - 04/25/2023 1:45 PM EST Formatting of this note  is different from the original.  Chief Complaint Patient presents with Right Elbow - Pain   History of the Present Illness: Brian Lang is a 58 y.o. male who has had chronic right lateral epicondylitis for about 3 years. He has had 4 injections over that time with Dr. Hamilton Capri. He comes in today wondering about potential surgery for chronic lateral epicondylitis.  He has been experiencing persistent pain in his right elbow for approximately 3 years, which has been managed by Dr. Hamilton Capri. He reports tenderness to the posterior aspect of his elbow, accompanied by a loss of skin pigment. He initially perceived swelling in the area but was informed that is where he lost muscle mass from the injections. He states that he wore a brace for several years and engaged in exercises, although, he admits to not adhering strictly to the recommended regimen. He expresses interest in receiving another cortisone injection if surgical intervention proves ineffective. He is right-handed and does not experience any numbness or tingling in his hand.  His general health is fairly good. Dr. Burnett Sheng gives him hydrocodone for arthritis. He had abdomen surgery and had a colostomy bag for 6 months due to diverticulitis. He cannot take ibuprofen because it causes bleeding.  He retired about 3 years ago. 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 He also does frequent leaf blowing and weeding.  I have reviewed past medical, surgical, social and family history, and allergies as documented in the EMR.  Past Medical History: Past Medical History: Diagnosis Date Arthritis Cervicalgia Diverticulitis Gastritis 11/18/2014 Headache(784.0) History of cholesteatoma History of migraine Other and unspecified  hyperlipidemia Psoriasis Shingles Sleep apnea Hx of has not used CPAP in 5 years Unspecified essential hypertension Unspecified hypothyroidism  Past Surgical History: Past Surgical History: Procedure Laterality Date COLONOSCOPY THROUGH STOMA 01/28/2014 Procedure: COLONOSCOPY THRU STOMA,LESN RMVL W/SNARE; Surgeon: Rush Landmark, MD; Location: [facility] ENDO/BRONCH; Service: Gastroenterology;; CLOSURE ENTEROSTOMY LARGE/SMALL INTESTINE W/COLON ANASTAMOSIS N/A 03/04/2014 Procedure: CLOSURE OF ENTEROSTOMY, LARGE OR SMALL INTESTINE; WITH RESECTION AND COLORECTAL ANASTOMOSIS (EG, CLOSURE OF HARTMANN TYPE PROCEDURE), Hartmann's takedown, ERAS; Surgeon: Clifton James, MD; Location: [facility] OR; Service: General Surgery; Laterality: N/A; CYSTOURETHROSCOPY W/INSERTION/EXCHANGE URETERAL STENT Left 03/04/2014 Procedure: CYSTOURETHROSCOPY WITH INSERTION LEFT URETERAL STENT; Surgeon: Jerilee Hoh., MD; Location: [facility] OR; Service: Urology; Laterality: Left; EGD 11/18/2014 Gastritis/Negative for H. Pylori/No Repeat/PYO HERNIA REPAIR 08/26/2015 COLONOSCOPY N/A 05/16/2019 Procedure: COLORECTAL CANCER SCREENING; COLONOSCOPY ON INDIVIDUAL AT HIGH RISK; Surgeon: Daralene Milch., MD; Location: [facility] ENDO/BRONCH; Service: Gastroenterology; Laterality: N/A; Cholesteatoma Surgery 1980, 2003 x 2 by ENT COLON SURGERY COLOSTOMY KNEE ARTHROSCOPY Left Tubes in ears  Past Family History: Family History Problem Relation Age of Onset  Stroke Maternal Uncle Throat cancer Maternal Uncle smoker Cancer Other Thyroid disease Mother Rheumatic fever Sister Alcohol abuse Sister Diabetes type II Brother Rheumatic fever Brother Colon cancer Paternal Uncle Alzheimer's disease Father 77 Anesthesia problems Neg Hx Malignant hypertension Neg Hx Malignant hyperthermia Neg Hx Pseudochol deficiency Neg Hx Colon polyps Neg Hx Inflammatory bowel disease Neg  Hx  Medications: Current Outpatient Medications Medication Sig Dispense Refill CETIRIZINE HCL (ZYRTEC ORAL) Take by mouth once daily as needed. fluticasone propionate (FLONASE) 50 mcg/actuation nasal spray Place 2 sprays into both nostrils once daily as needed for Rhinitis 16 g 1 HYDROcodone-acetaminophen (NORCO) 5-325 mg tablet Take 1 tablet by mouth every 6 (six) hours as needed for Pain for up to 30 days 60 tablet 0 levothyroxine (SYNTHROID) 75 MCG tablet Take 1 tablet (75 mcg total) by mouth once daily Take on an empty stomach with a glass of water at least 30-60 minutes before breakfast. 90 tablet 3 mometasone (NASONEX) 50 mcg/actuation nasal spray SPRAY 2 SPRAYS INTO EACH NOSTRIL EVERY DAY 17 g 3 naloxegoL 12.5 mg Tab Take 1 tablet (12.5 mg total) by mouth once daily 30 tablet 2 omeprazole (PRILOSEC) 20 MG DR capsule TAKE 1 CAPSULE BY MOUTH EVERY DAY 90 capsule 3 [START ON 05/16/2023] HYDROcodone-acetaminophen (NORCO) 5-325 mg tablet Take 1 tablet by mouth every 6 (six) hours as needed for Pain for up to 30 days 60 tablet 0 [START ON 06/15/2023] HYDROcodone-acetaminophen (NORCO) 5-325 mg tablet Take 1 tablet by mouth every 6 (six) hours as needed for Pain for up to 30 days 60 tablet 0  No current facility-administered medications for this visit.  Allergies: Allergies Allergen Reactions Augmentin [Amoxicillin-Pot Clavulanate] Rash Cyclobenzaprine Hallucination and Other (See Comments) Other reaction(s): Hallucination bad dreams   Body mass index is 24.34 kg/m.  Review of Systems: A comprehensive 14 point ROS was performed, reviewed, and the pertinent orthopaedic findings are documented in the HPI.  Vitals: 04/25/23 1349 BP: 117/72   General Physical Examination:  Lungs are clear. Heart rate and rhythm is normal. HEENT is normal.  Musculoskeletal Examination: There is point tenderness in the right elbow with loss of skin pigment and fat atrophy.  Radiographs:  No new  imaging studies were obtained or reviewed today.  Assessment: ICD-10-CM 1. Lateral epicondylitis of right elbow M77.11  Plan:  The patient has clinical findings of chronic lateral epicondylitis of the right elbow.  He has been experiencing this condition for over 3 years and has received multiple injections. The injections have resulted in some skin discoloration and fat atrophy. Given these observations, further injections are not advisable. The proposed treatment plan involves a Nirschl procedure of the right lateral elbow. This procedure was thoroughly reviewed with him. Postoperatively, a splint will be applied for a brief period, and physical therapy may be required. He will need someone to drive him home after the surgery.  Document Attestation: Gillermina Phy, have reviewed and updated documentation for [facility], MD, utilizing Nuance DAX.  Electronically signed by Marlena Clipper, MD at 04/26/2023 8:18 AM EST  Reviewed  H+P. No changes noted.

## 2023-05-08 NOTE — Op Note (Signed)
 05/08/2023  2:07 PM  PATIENT:  Brian Lang  58 y.o. male  PRE-OPERATIVE DIAGNOSIS:  Lateral epicondylitis of right elbow M77.11  POST-OPERATIVE DIAGNOSIS:  Lateral epicondylitis of right elbow  PROCEDURE:  Procedure(s): Right elbow, Nirschl procedure, right lateral epicondyle (Right)  SURGEON: Leitha Schuller, MD  ASSISTANTS: None  ANESTHESIA:   general  EBL:  Total I/O In: 550 [I.V.:450; IV Piggyback:100] Out: 2 [Blood:2]  BLOOD ADMINISTERED:none  DRAINS: none   LOCAL MEDICATIONS USED:  OTHER preop block by anesthesia  SPECIMEN:  No Specimen  DISPOSITION OF SPECIMEN:  N/A  COUNTS:  YES  TOURNIQUET:   Total Tourniquet Time Documented: Upper Arm (Right) - 5 minutes Total: Upper Arm (Right) - 5 minutes   IMPLANTS: None  DICTATION: .Dragon Dictation patient was brought to the operating room and after adequate general anesthesia was obtained in the preop block given the right arm was prepped and draped in the usual sterile fashion with a tourniquet applied to the upper arm.  After patient identification and timeout procedures were completed tourniquet was raised and an incision directly over the lateral epicondyle was made.  Skin and subcutaneous tissue were divided and the common extensor origin identified and incised elevated anterior and posterior off the lateral epicondyle there was sclerotic bone present as well as some devascularized tendinous insertion area in the area consistent with chronic lateral epicondylitis this was debrided with use of a rongeurs and then the bone was debrided to get down to bleeding bone after was felt to be adequate resection of the lateral epicondyle bleeding bone tourniquet was let down to check for hemostasis as well as make sure the bone had adequate bleeding a small additional portion of bone was debrided to make sure was a smooth surface and no sharp edges.  The wound was thoroughly irrigated and then the common extensor origin repaired  using 3-0 Vicryl in a figure-of-eight fashion over the denuded bony surface 4-0 nylon for the skin in a simple interrupted fashion.  Xeroform 4 x 4 web roll and Ace wrap applied.  PLAN OF CARE: Discharge to home after PACU  PATIENT DISPOSITION:  PACU - hemodynamically stable.

## 2023-05-08 NOTE — Transfer of Care (Signed)
 Immediate Anesthesia Transfer of Care Note  Patient: Brian Lang  Procedure(s) Performed: Right elbow, Nirschl procedure, right lateral epicondyle (Right: Elbow)  Patient Location: PACU  Anesthesia Type: No value filed.  Level of Consciousness: awake, alert  and patient cooperative  Airway and Oxygen Therapy: Patient Spontanous Breathing and Patient connected to supplemental oxygen  Post-op Assessment: Post-op Vital signs reviewed, Patient's Cardiovascular Status Stable, Respiratory Function Stable, Patent Airway and No signs of Nausea or vomiting  Post-op Vital Signs: Reviewed and stable  Complications: No notable events documented.

## 2023-05-08 NOTE — Progress Notes (Signed)
 Assisted [place] ANMD with supraclavicular, ultrasound guided block. Side rails up, monitors on throughout procedure. See vital signs in flow sheet. Tolerated Procedure well.

## 2023-05-08 NOTE — Anesthesia Procedure Notes (Signed)
 Anesthesia Regional Block: Supraclavicular block   Pre-Anesthetic Checklist: , timeout performed,  Correct Patient, Correct Site, Correct Laterality,  Correct Procedure, Correct Position, site marked,  Risks and benefits discussed,  Surgical consent,  Pre-op evaluation,  At surgeon's request and post-op pain management  Laterality: Right and Upper  Prep: chloraprep       Needles:  Injection technique: Single-shot  Needle Type: Echogenic Stimulator Needle      Needle Gauge: 21     Additional Needles:   Procedures:, nerve stimulator,,, ultrasound used (permanent image in chart),,     Nerve Stimulator or Paresthesia:  Response: bicep contraction, 0.45 mA  Additional Responses:   Narrative:  Start time: 05/08/2023 12:59 PM End time: 05/08/2023 1:00 PM Injection made incrementally with aspirations every 5 mL.  Performed by: Personally  Anesthesiologist: Marisue Humble, MD  Additional Notes: Functioning IV was confirmed and monitors applied.  Sterile prep and drape,hand hygiene and sterile gloves were used.Ultrasound guidance: relevant anatomy identified, needle position confirmed, local anesthetic spread visualized around nerve(s)., vascular puncture avoided.  Image printed for medical record.  Negative aspiration and negative test dose prior to incremental administration of local anesthetic. The patient tolerated the procedure well. Vitals signs recorded in RN  also notes. Also intercostobrachial and ulnar as field blocks.

## 2023-05-08 NOTE — Discharge Instructions (Addendum)
 PERIPHERAL NERVE BLOCK PATIENT INFORMATION  Your surgeon has requested a peripheral nerve block for your surgery. This anesthetic technique provides excellent post-operative pain relief for you in a safe and effective manner. It will also help reduce the risk of nausea and vomiting and allow earlier discharge from the hospital.   The block is performed under sedation with ultrasound guidance prior to your procedure. Due to the sedation, your may or may not remember the block experience. The nerve block will begin to take effect anywhere from 5 to 30 minutes after being administered. You will be transported to the operating room from your surgery after the block is completed.   At the end of surgery, when the anesthesia wears off, you will notice a few things. Your may not be able to move or feel the part of your body targeted by the nerve block. These are normal experiences, and they will disappear as the block wears off.  If you had an interscalene nerve block performed (which is common for shoulder surgery), your voice can be very hoarse and you may feel that you are not able to take as deep a breath as you did before surgery. Some patients may also notice a droopy eyelid on the affected side. These symptoms will resolve once the block wears off.  Pain control: The nerve block technique used is a single injection that can last anywhere from 1-3 days. The duration of the numbness can vary between individuals. After leaving the hospital, it is important that you begin to take your prescribed pain medication when you start to sense the nerve block wearing off. This will help you avoid unpleasant pain at the time the nerve block wears off, which can sometimes be in the middle of the night. The block will only cover pain in the areas targeted by the nerve block so if you experience surgical pain outside of that area, please take your prescribed pain medication. Management of the "numb area": After a nerve  block, you cannot feel pain, pressure, or temperature in the affected area so there is an increased risk for injury. You should take extra care to protect the affected areas until sensation and movement returns. Please take caution to not come in contact with extremely hot or cold items because you will not be able to sense or protect yourself form the extremes of temperature.  You may experience some persistent numbness after the procedure by most neurological deficits resolve over time and the incidence of serious long term neurological complications attributable to peripheral nerve blocks are relatively uncommon.  PERIPHERAL NERVE BLOCK PATIENT INFORMATION  Your surgeon has requested a peripheral nerve block for your surgery. This anesthetic technique provides excellent post-operative pain relief for you in a safe and effective manner. It will also help reduce the risk of nausea and vomiting and allow earlier discharge from the hospital.   The block is performed under sedation with ultrasound guidance prior to your procedure. Due to the sedation, your may or may not remember the block experience. The nerve block will begin to take effect anywhere from 5 to 30 minutes after being administered. You will be transported to the operating room from your surgery after the block is completed.   At the end of surgery, when the anesthesia wears off, you will notice a few things. Your may not be able to move or feel the part of your body targeted by the nerve block. These are normal experiences, and they will disappear as  the block wears off.  If you had an interscalene nerve block performed (which is common for shoulder surgery), your voice can be very hoarse and you may feel that you are not able to take as deep a breath as you did before surgery. Some patients may also notice a droopy eyelid on the affected side. These symptoms will resolve once the block wears off.  Pain control: The nerve block technique used is  a single injection that can last anywhere from 1-3 days. The duration of the numbness can vary between individuals. After leaving the hospital, it is important that you begin to take your prescribed pain medication when you start to sense the nerve block wearing off. This will help you avoid unpleasant pain at the time the nerve block wears off, which can sometimes be in the middle of the night. The block will only cover pain in the areas targeted by the nerve block so if you experience surgical pain outside of that area, please take your prescribed pain medication. Management of the "numb area": After a nerve block, you cannot feel pain, pressure, or temperature in the affected area so there is an increased risk for injury. You should take extra care to protect the affected areas until sensation and movement returns. Please take caution to not come in contact with extremely hot or cold items because you will not be able to sense or protect yourself form the extremes of temperature.  You may experience some persistent numbness after the procedure by most neurological deficits resolve over time and the incidence of serious long term neurological complications attributable to peripheral nerve blocks are relatively uncommon.    Keep dressing clean and dry Loosen Ace wrap if fingers get swollen Try to minimize wrist motion or gripping with right hand Pain medicine as previously given

## 2023-05-08 NOTE — Anesthesia Postprocedure Evaluation (Signed)
 Anesthesia Post Note  Patient: Brian Lang  Procedure(s) Performed: Right elbow, Nirschl procedure, right lateral epicondyle (Right: Elbow)  Patient location during evaluation: PACU Anesthesia Type: General Level of consciousness: awake and alert Pain management: pain level controlled Vital Signs Assessment: post-procedure vital signs reviewed and stable Respiratory status: spontaneous breathing, nonlabored ventilation, respiratory function stable and patient connected to nasal cannula oxygen Cardiovascular status: blood pressure returned to baseline and stable Postop Assessment: no apparent nausea or vomiting Anesthetic complications: no   No notable events documented.   Last Vitals:  Vitals:   05/08/23 1412 05/08/23 1429  BP: 139/73 136/82  Pulse: 74 75  Resp: 15 14  Temp: 36.5 C 36.6 C  SpO2: 100% 100%    Last Pain:  Vitals:   05/08/23 1429  TempSrc:   PainSc: 0-No pain                 Janetta Vandoren C Kristjan Derner

## 2023-05-08 NOTE — Anesthesia Procedure Notes (Signed)
 Procedure Name: LMA Insertion Date/Time: 05/08/2023 1:37 PM  Performed by: Ellamarie Naeve, [place], CRNAPre-anesthesia Checklist: Patient identified, Patient being monitored, Timeout performed, Emergency Drugs available and Suction available Patient Re-evaluated:Patient Re-evaluated prior to induction Oxygen Delivery Method: Circle system utilized Preoxygenation: Pre-oxygenation with 100% oxygen Induction Type: IV induction Ventilation: Mask ventilation without difficulty LMA: LMA inserted LMA Size: 4.0 Tube type: Oral Number of attempts: 1 Placement Confirmation: positive ETCO2 and breath sounds checked- equal and bilateral Tube secured with: Tape Dental Injury: Teeth and Oropharynx as per pre-operative assessment

## 2023-05-09 ENCOUNTER — Encounter: Payer: Self-pay | Admitting: Orthopedic Surgery
